# Patient Record
Sex: Female | Born: 1995 | Hispanic: Yes | Marital: Single | Smoking: Never smoker
Health system: Southern US, Community
[De-identification: ages and names within clinical notes are randomized; demographics above are authoritative.]

## PROBLEM LIST (undated history)

## (undated) DIAGNOSIS — D649 Anemia, unspecified: Secondary | ICD-10-CM

## (undated) HISTORY — DX: Anemia, unspecified: D64.9

---
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
## 2014-07-25 NOTE — H&P (Signed)
L&D Evaluation:  History Expanded:  HPI 19 yo G1, EDD of 04/21/13, presents in active labor (4 cm per RN). Originally scheduled for IOL tonight. [facility], late to care at 24 wks, teen pregnancy, severe anemia responding well to Fe replacement. Otherwise unremarkable.   Blood Type (Maternal) O positive   Group B Strep Results Maternal (Result >5wks must be treated as unknown) negative   Maternal HIV Negative   Maternal Syphilis Ab Nonreactive   Maternal Varicella Non-Immune   Rubella Results (Maternal) nonimmune   Maternal T-Dap Unknown   Presents with contractions   Patient's Medical History No Chronic Illness   Patient's Surgical History none   Medications Pre Natal Vitamins  Iron   Allergies NKDA   Social History none   Exam:  Vital Signs stable   General no apparent distress   Mental Status clear   Chest clear   Heart no murmur/gallop/rubs   Abdomen gravid, tender with contractions   Estimated Fetal Weight Average for gestational age, 87 #   Pelvic 4-5 per RN   Mebranes Intact   FHT normal rate with no decels, baseline 145, mod var, + accels   Ucx regular   Impression:  Impression active labor   Plan:  Plan monitor contractions and for cervical change   Comments Given IV meds already Requesting epidural Consider AROM once comfortable after epidural   Electronic Signatures: Vella KohlerBrothers, Livi Mcgann K (CNM)  (Signed 12-Feb-15 11:40)  Authored: L&D Evaluation   Last Updated: 12-Feb-15 11:40 by Vella KohlerBrothers, Taralee Marcus K (CNM)

## 2016-06-11 LAB — HM PAP SMEAR: HM Pap smear: NEGATIVE

## 2018-02-27 ENCOUNTER — Emergency Department: Payer: Self-pay

## 2018-02-27 ENCOUNTER — Other Ambulatory Visit: Payer: Self-pay

## 2018-02-27 ENCOUNTER — Emergency Department
Admission: EM | Admit: 2018-02-27 | Discharge: 2018-02-27 | Disposition: A | Discharge status: Home / Self Care | Payer: Self-pay | Attending: Emergency Medicine | Admitting: Emergency Medicine

## 2018-02-27 DIAGNOSIS — O039 Complete or unspecified spontaneous abortion without complication: Secondary | ICD-10-CM | POA: Insufficient documentation

## 2018-02-27 DIAGNOSIS — O3680X0 Pregnancy with inconclusive fetal viability, not applicable or unspecified: Secondary | ICD-10-CM

## 2018-02-27 DIAGNOSIS — Z3A00 Weeks of gestation of pregnancy not specified: Secondary | ICD-10-CM | POA: Insufficient documentation

## 2018-02-27 LAB — CBC WITH DIFFERENTIAL/PLATELET
Abs Immature Granulocytes: 0.03 10*3/uL (ref 0.00–0.07)
Basophils Absolute: 0 10*3/uL (ref 0.0–0.1)
Basophils Relative: 0 %
Eosinophils Absolute: 0 10*3/uL (ref 0.0–0.5)
Eosinophils Relative: 0 %
HCT: 32.3 % — ABNORMAL LOW (ref 36.0–46.0)
Hemoglobin: 10.4 g/dL — ABNORMAL LOW (ref 12.0–15.0)
IMMATURE GRANULOCYTES: 0 %
LYMPHS PCT: 14 %
Lymphs Abs: 1.3 10*3/uL (ref 0.7–4.0)
MCH: 25.6 pg — ABNORMAL LOW (ref 26.0–34.0)
MCHC: 32.2 g/dL (ref 30.0–36.0)
MCV: 79.6 fL — ABNORMAL LOW (ref 80.0–100.0)
Monocytes Absolute: 0.4 10*3/uL (ref 0.1–1.0)
Monocytes Relative: 4 %
NEUTROS PCT: 82 %
Neutro Abs: 7.5 10*3/uL (ref 1.7–7.7)
Platelets: 213 10*3/uL (ref 150–400)
RBC: 4.06 MIL/uL (ref 3.87–5.11)
RDW: 13.2 % (ref 11.5–15.5)
WBC: 9.2 10*3/uL (ref 4.0–10.5)
nRBC: 0 % (ref 0.0–0.2)

## 2018-02-27 LAB — CBC
HEMATOCRIT: 35.3 % — AB (ref 36.0–46.0)
Hemoglobin: 11.2 g/dL — ABNORMAL LOW (ref 12.0–15.0)
MCH: 25.5 pg — ABNORMAL LOW (ref 26.0–34.0)
MCHC: 31.7 g/dL (ref 30.0–36.0)
MCV: 80.4 fL (ref 80.0–100.0)
Platelets: 223 10*3/uL (ref 150–400)
RBC: 4.39 MIL/uL (ref 3.87–5.11)
RDW: 13.2 % (ref 11.5–15.5)
WBC: 7.3 10*3/uL (ref 4.0–10.5)
nRBC: 0 % (ref 0.0–0.2)

## 2018-02-27 LAB — HCG, QUANTITATIVE, PREGNANCY: hCG, Beta Chain, Quant, S: 3593 m[IU]/mL — ABNORMAL HIGH (ref <5)

## 2018-02-27 LAB — POCT PREGNANCY, URINE: Preg Test, Ur: POSITIVE — AB

## 2018-02-27 MED ORDER — SODIUM CHLORIDE 0.9 % IV BOLUS
1000.0000 mL | Freq: Once | INTRAVENOUS | Status: AC
  Administered 2018-02-27: 1000 mL via INTRAVENOUS

## 2018-02-27 NOTE — Discharge Instructions (Addendum)
Follow-up with Dr. Bonney AidStaebler in 1 week as instructed.  Return to the ER for new, worsening, persistent severe pain, bleeding, weakness or lightheadedness, fevers, or any other new or worsening symptoms that concern you.

## 2018-02-27 NOTE — ED Notes (Signed)
Pelvic exam completed. Patient awaiting GYN consult.

## 2018-02-27 NOTE — ED Notes (Signed)
Patient has not had US for this pregnancy. US ordered.

## 2018-02-27 NOTE — ED Triage Notes (Addendum)
Pt comes via POV from home with c/o vaginal bleeding that started Thursday. Pt states also some brown discharge.  Pt states earlier today she passed a blood clot and has increased bleeding. Pt states she has gone through more pads than earlier. Pt also states that when she got out of the car she noticed that she had blood on pants.  Pt states home pregnancy test resulted positive. LMP was September and test taken in November. Pt has not yet seen OBGYN.  Pt also states abdominal cramping and pain.

## 2018-02-27 NOTE — ED Notes (Addendum)
Reports positive pregnancy test at home not sure how many weeks. LMP 11/23/17. Reports experiencing pain and mild  bleeding that began Thursday, last night bleeding and pain increased reports now also bleeding clots. U/S ordred from triage nurse. Patient awaiting in room, for Md eval/assessment.

## 2018-02-27 NOTE — ED Notes (Signed)
Patient to and from u/s. Awaiting results for further plan of care.

## 2018-02-27 NOTE — ED Notes (Addendum)
OB at bedside to do pelvic exam. Products of Conception withdrawn from cervic, placed in fluid and sent to lab. IV placed and labs drawn. IVF to infused.

## 2018-02-27 NOTE — H&P (Signed)
Obstetrics & Gynecology Consult H&P   Consulting Department: Emergency Deperatment  Consulting Physician: Dionne Bucy, MD  Consulting Question: Pregnancy unknown location   History of Present Illness: Patient is a 22 y.o. G1P0 presenting with vaginal bleeding and cramping.  She reports this started this morning.  The abdominal pain was generalized to the lower abdomen and felt like contractions.  She states abdominal pain has since resolved and she curently has not abdominal pain.   She still reports bleeding.  HCG was past the discrimination zone at 3566mIU/mL.  Ultrasound did not reveal and IUP but did comment on a right ovarian cyst favored to be a corpus luteum.  My review of the images revealed the mass to be elongated and the mass in question to likely be the gestational sac in the vagina.    Her prior pregnancy was uncomplicated in 2015.  Review of Systems:10 point review of systems  Past Medical History:  History reviewed. No pertinent past medical history.  Past Surgical History:  History reviewed. No pertinent surgical history.  Gynecologic History:   Obstetric History: G1P0  Family History:  No family history on file.  Social History:  Social History   Socioeconomic History  . Marital status: Single    Spouse name: Not on file  . Number of children: Not on file  . Years of education: Not on file  . Highest education level: Not on file  Occupational History  . Not on file  Social Needs  . Financial resource strain: Not on file  . Food insecurity:    Worry: Not on file    Inability: Not on file  . Transportation needs:    Medical: Not on file    Non-medical: Not on file  Tobacco Use  . Smoking status: Not on file  Substance and Sexual Activity  . Alcohol use: Not on file  . Drug use: Not on file  . Sexual activity: Not on file  Lifestyle  . Physical activity:    Days per week: Not on file    Minutes per session: Not on file  . Stress: Not  on file  Relationships  . Social connections:    Talks on phone: Not on file    Gets together: Not on file    Attends religious service: Not on file    Active member of club or organization: Not on file    Attends meetings of clubs or organizations: Not on file    Relationship status: Not on file  . Intimate partner violence:    Fear of current or ex partner: Not on file    Emotionally abused: Not on file    Physically abused: Not on file    Forced sexual activity: Not on file  Other Topics Concern  . Not on file  Social History Narrative  . Not on file    Allergies:  Not on File  Medications: Prior to Admission medications   Not on File    Physical Exam Vitals: Blood pressure 100/70, pulse (!) 148, temperature 98.6 F (37 C), temperature source Oral, resp. rate 20, height 5\' 2"  (1.575 m), weight 68 kg, last menstrual period 11/23/2017, SpO2 100 %. General: NAD HEENT: normocephalic, anicteric Pulmonary: No increased work of breathing Cardiovascular: RRR, distal pulses 2+ Abdomen: soft, non-tender, non-destended Genitourinary: Some blood on perineum, bimanual exam revealed clot or mass which prevented palpation of the cervix, this turned out to be in an intact gestational sac.  Cervix was 1.5cm dilated.  On speculum exam no residual products of conception visualized with minimal bleeding Extremities: no edema, erythema, or tenderness Neurologic: Grossly intact Psychiatric: mood appropriate, affect full  Labs: Results for orders placed or performed during the hospital encounter of 02/27/18 (from the past 72 hour(s))  ABO/Rh     Status: None   Collection Time: 02/27/18 11:26 AM  Result Value Ref Range   ABO/RH(D)      O POS Performed at [facility], [address] [place], [place] [address]   CBC     Status: Abnormal   Collection Time: 02/27/18 11:36 AM  Result Value Ref Range   WBC 7.3 4.0 - 10.5 K/uL   RBC 4.39 3.87 - 5.11 MIL/uL   Hemoglobin 11.2 (L)  12.0 - 15.0 g/dL   HCT 08.6 (L) 57.8 - 46.9 %   MCV 80.4 80.0 - 100.0 fL   MCH 25.5 (L) 26.0 - 34.0 pg   MCHC 31.7 30.0 - 36.0 g/dL   RDW 62.9 52.8 - 41.3 %   Platelets 223 150 - 400 K/uL   nRBC 0.0 0.0 - 0.2 %    Comment: Performed at [facility], [address] [place], [place] [address]  hCG, quantitative, pregnancy     Status: Abnormal   Collection Time: 02/27/18 11:36 AM  Result Value Ref Range   hCG, Beta Chain, Quant, S 3,593 (H) <5 mIU/mL    Comment:          GEST. AGE      CONC.  (mIU/mL)   <=1 WEEK        5 - 50     2 WEEKS       50 - 500     3 WEEKS       100 - 10,000     4 WEEKS     1,000 - 30,000     5 WEEKS     3,500 - 115,000   6-8 WEEKS     12,000 - 270,000    12 WEEKS     15,000 - 220,000        FEMALE AND NON-PREGNANT FEMALE:     LESS THAN 5 mIU/mL Performed at [facility], [address] [place], [place] [address]   Pregnancy, urine POC     Status: Abnormal   Collection Time: 02/27/18 11:57 AM  Result Value Ref Range   Preg Test, Ur POSITIVE (A) NEGATIVE    Comment:        THE SENSITIVITY OF THIS METHODOLOGY IS >24 mIU/mL     Imaging US Ob Comp Less 14 Wks  Addendum Date: 02/27/2018   ADDENDUM REPORT: 02/27/2018 15:49 ADDENDUM: Findings discussed with the ER physician. Electronically Signed   By: Gerome Sam III M.D   On: 02/27/2018 15:49   Result Date: 02/27/2018 CLINICAL DATA:  Vaginal bleeding.  Abdominal pain. EXAM: OBSTETRIC <14 WK ULTRASOUND TECHNIQUE: Transabdominal ultrasound was performed for evaluation of the gestation as well as the maternal uterus and adnexal regions. COMPARISON:  None. FINDINGS: Intrauterine gestational sac: None Maternal uterus/adnexae: The uterus measures 9.7 x 5.3 x 7.8 cm with a volume of 198 mL. A mass measuring 7.1 x 4.2 x 6.3 cm and containing a dominant cystic mass measuring 4.7 x 2.5 x 3.5 cm in the right adnexa is favored to be the ovary. A separate right adnexal mass is not noted. The  left ovary measures 3.7 x 1.6 x 2.8 cm. No free fluid in the pelvis.  IMPRESSION: 1. No IUP is identified. The absence of an IUP in the setting of a positive beta HCG could represent an unidentified ectopic pregnancy, recent miscarriage, or early pregnancy. Recommend clinical correlation and close follow-up. 2. The 7.1 x 4.2 x 6.3 cm mass in the right adnexa containing a 4.7 cm dominant cyst is favored to be the right ovary with a dominant corpus luteum cyst. A separate adnexal mass or ovary is not seen. Recommend attention on follow-up. The left ovary is normal. 3. No free fluid in the pelvis. Of note, endovaginal imaging was not performed and is recommended for further characterization. Electronically Signed: By: Gerome Samavid  Williams III M.D On: 02/27/2018 14:50    Assessment: 22 y.o. G1P0  Plan: 1) Vaginal bleeding abdominal pain - intact gestational sac removed from lower uterine segment on bimanual exam and sent to pathology  Condolences were offered to the patient and her family.  I stressed that while emotionally difficult, that this did not occur because of an actions or inactions by the patient.  Somewhere between 10-20% of identified first trimester pregnancies will unfortunately end in miscarriage.  Given this relatively high incidence rate, further diagnostic testing such as chromosome analysis is generally not clinically relevant nor recommended.  Although the chromosomal abnormalities have been implicated at rates as high as 70% in some studies, these are generally random and do not infer and increased risk of recurrence with subsequent pregnancies.  However, 3 or more consecutive first trimester losses are relatively uncommon, and these patient generally do benefit from additional work up to determine a potential modifiable etiology.   We briefly discussed management options including expectant management, medical management, and surgical management as well as their relative success rates and  complications. Approximately 80% of first trimester miscarriages will pass successfully but may require a time frame of up to 8 weeks (ACOG Practice Bulletin 150 May 2015 "Early Pregnancy Loss").  Medical management using 800mcg of misoprostil administered every 3-hrs as needed for up to 3 doses speeds up the time frame to completion significantly, has literature supporting its use up to 63 days or 246w0d gestation and results in a passage rate of 84-85% (ACOG Practice Bulletin 143 March 2014 "Medical Management of First-Trimester Abortion").  Dilation and curettage has the highest rate of uterine evacuation, but carries with is operative cost, surgical and anesthetic risk.  While these risk are relatively small they nevertheless include infection, bleeding, uterine perforation, formation of uterine synechia, and in rare cases death.   We discussed repeat ultrasound and or trending HCG levels if the patient wishes to pursue these prior to making her decision.  Clinically I am confident of the diagnosis, but I do not want any doubts in the patient's mind regarding the plan of management she chooses to adopt.  I will allow the patient and her family to discuss management options and she was advised to contact the office to arrange final disposition one she has made her decision or should she have any follow up questions for myself.    The patient is Rh positive and rhogam is therefore not indicated.     Vena AustriaAndreas Jalena Vanderlinden, MD, Evern CoreFACOG [facility], [facility] 02/27/2018, 4:53 PM

## 2018-02-27 NOTE — ED Provider Notes (Signed)
[facility] Emergency Department Provider Note ____________________________________________   First MD Initiated Contact with Patient 02/27/18 1352     (approximate)  I have reviewed the triage vital signs and the nursing notes.   HISTORY  Chief Complaint Vaginal Bleeding    HPI Kaitlyn Steele is a 22 y.o. female with PMH as noted below who is a G2 P1 and who presents with vaginal bleeding, gradual onset 2 days ago, but more severe and acute since 3 AM last night with clots.  The patient also reports lower abdominal crampy pain since 3 AM.  She states that since she has been in the ED it has been subsiding somewhat.  She denies lightheadedness or dizziness.  She denies vomiting or fever.  The patient states that her last normal period was in September and she has taken 2 pregnancy tests which were positive.  History reviewed. No pertinent past medical history.  There are no active problems to display for this patient.   History reviewed. No pertinent surgical history.  Prior to Admission medications   Not on File    Allergies Patient has no allergy information on record.  No family history on file.  Social History Social History   Tobacco Use  . Smoking status: Not on file  Substance Use Topics  . Alcohol use: Not on file  . Drug use: Not on file    Review of Systems  Constitutional: No fever. Eyes: No redness. ENT: No sore throat. Cardiovascular: Denies chest pain. Respiratory: Denies shortness of breath. Gastrointestinal: No vomiting.  Genitourinary: Positive for vaginal bleeding.  Musculoskeletal: Negative for back pain. Skin: Negative for rash. Neurological: Negative for headache.   ____________________________________________   PHYSICAL EXAM:  VITAL SIGNS: ED Triage Vitals  Enc Vitals Group     BP 02/27/18 1133 113/74     Pulse Rate 02/27/18 1133 (!) 127     Resp 02/27/18 1133 18     Temp 02/27/18 1133 99.3 F (37.4  C)     Temp Source 02/27/18 1432 Oral     SpO2 02/27/18 1133 97 %     Weight 02/27/18 1132 150 lb (68 kg)     Height 02/27/18 1132 5\' 2"  (1.575 m)     Head Circumference --      Peak Flow --      Pain Score 02/27/18 1132 6     Pain Loc --      Pain Edu? --      Excl. in GC? --     Constitutional: Alert and oriented. Well appearing and in no acute distress. Eyes: Conjunctivae are normal.  No pallor. Head: Atraumatic. Nose: No congestion/rhinnorhea. Mouth/Throat: Mucous membranes are moist.   Neck: Normal range of motion.  Cardiovascular:  Good peripheral circulation. Respiratory: Normal respiratory effort.   Gastrointestinal: Soft and nontender. No distention.  Genitourinary: Moderate amount of blood in the vaginal vault.  Cervical os difficult to palpate. Musculoskeletal: Extremities warm and well perfused.  Neurologic:  Normal speech and language. No gross focal neurologic deficits are appreciated.  Skin:  Skin is warm and dry. No rash noted. Psychiatric: Mood and affect are normal. Speech and behavior are normal.  ____________________________________________   LABS (all labs ordered are listed, but only abnormal results are displayed)  Labs Reviewed  CBC - Abnormal; Notable for the following components:      Result Value   Hemoglobin 11.2 (*)    HCT 35.3 (*)    MCH 25.5 (*)  All other components within normal limits  HCG, QUANTITATIVE, PREGNANCY - Abnormal; Notable for the following components:   hCG, Beta Chain, Quant, S 3,593 (*)    All other components within normal limits  CBC WITH DIFFERENTIAL/PLATELET - Abnormal; Notable for the following components:   Hemoglobin 10.4 (*)    HCT 32.3 (*)    MCV 79.6 (*)    MCH 25.6 (*)    All other components within normal limits  POCT PREGNANCY, URINE - Abnormal; Notable for the following components:   Preg Test, Ur POSITIVE (*)    All other components within normal limits  POC URINE PREG, ED  ABO/RH  SURGICAL  PATHOLOGY   ____________________________________________  EKG   ____________________________________________  RADIOLOGY  US pelvis: No IUP identified.  Right ovarian mass.  No significant free fluid.  ____________________________________________   PROCEDURES  Procedure(s) performed: No  Procedures  Critical Care performed: No ____________________________________________   INITIAL IMPRESSION / ASSESSMENT AND PLAN / ED COURSE  Pertinent labs & imaging results that were available during my care of the patient were reviewed by me and considered in my medical decision making (see chart for details).  22 year old female G2P1 at unknown dates presents with vaginal bleeding over 2 days, with worsening bleeding and crampy pain since 3 AM last night.  On exam the patient is relatively well-appearing.  She was initially slightly tachycardic but her vital signs are now normal.  The remainder of the exam is as described above.  Her abdomen is soft and nontender.  Overall the most likely etiology is miscarriage, however differential also includes ectopic or threatened abortion.  We will obtain lab work-up, ultrasound, and reassess.  ----------------------------------------- 7:42 PM on 02/27/2018 -----------------------------------------  Ultrasound showed no IUP, and a right adnexal cyst or mass.  The patient's vital signs remained relatively stable although she was borderline tachycardic, and with any discomfort such as during the pelvic exam, she became significantly tachycardic.  Although the ultrasound findings were more consistent with spontaneous abortion, since she had no prior confirmed IUP, differential also included ectopic pregnancy.  The patient continued to appear well and her abdomen was soft and nontender.  I had difficulty adequately assessing the cervix on pelvic exam.  I consulted Dr. Bonney AidStaebler from OB/GYN who came and evaluated the patient.  He initially considered  admission for observation, however when he performed a pelvic exam the cervical loss was open and he extracted a significant amount of products of conception which then relieved the patient's symptoms.  He advised that her presentation was consistent with spontaneous abortion.  Repeat CBC was stable, the patient's vital signs normalized after fluid.  She continues to appear comfortable and had no further pain.  I counseled her on return precautions and she expressed understanding.  Dr. Bonney AidStaebler cleared her for discharge and recommended follow-up next week.  ____________________________________________   FINAL CLINICAL IMPRESSION(S) / ED DIAGNOSES  Final diagnoses:  Spontaneous miscarriage      NEW MEDICATIONS STARTED DURING THIS VISIT:  There are no discharge medications for this patient.    Note:  This document was prepared using Dragon voice recognition software and may include unintentional dictation errors.    Dionne BucySiadecki, Pervis Macintyre, MD 02/27/18 1944

## 2018-03-02 LAB — SURGICAL PATHOLOGY

## 2018-03-07 LAB — ABO/RH: ABO/RH(D): O POS

## 2018-10-25 ENCOUNTER — Other Ambulatory Visit: Payer: Self-pay

## 2018-10-25 DIAGNOSIS — Z20822 Contact with and (suspected) exposure to covid-19: Secondary | ICD-10-CM

## 2018-10-26 LAB — NOVEL CORONAVIRUS, NAA: SARS-CoV-2, NAA: NOT DETECTED

## 2019-02-09 ENCOUNTER — Ambulatory Visit (LOCAL_COMMUNITY_HEALTH_CENTER): Payer: Self-pay

## 2019-02-09 ENCOUNTER — Other Ambulatory Visit: Payer: Self-pay

## 2019-02-09 VITALS — BP 127/78 | Ht 62.0 in | Wt 146.5 lb

## 2019-02-09 DIAGNOSIS — Z3201 Encounter for pregnancy test, result positive: Secondary | ICD-10-CM

## 2019-02-09 LAB — PREGNANCY, URINE: Preg Test, Ur: POSITIVE — AB

## 2019-02-09 MED ORDER — PRENATAL VITAMIN 27-0.8 MG PO TABS: 1.0000 | ORAL_TABLET | Freq: Every day | ORAL | 0 refills | Status: AC

## 2019-02-09 NOTE — Progress Notes (Signed)
Pt plans prenatal care at [facility]; declined preadmit, states she already talked to clerk about speaking with a Medicaid worker.

## 2019-03-17 ENCOUNTER — Ambulatory Visit (INDEPENDENT_AMBULATORY_CARE_PROVIDER_SITE_OTHER): Payer: Medicaid Other | Admitting: Obstetrics & Gynecology

## 2019-03-17 ENCOUNTER — Ambulatory Visit (INDEPENDENT_AMBULATORY_CARE_PROVIDER_SITE_OTHER): Payer: Medicaid Other

## 2019-03-17 ENCOUNTER — Other Ambulatory Visit (HOSPITAL_COMMUNITY)
Admission: RE | Admit: 2019-03-17 | Discharge: 2019-03-17 | Disposition: A | Discharge status: Home / Self Care | Payer: Medicaid Other | Source: Ambulatory Visit | Attending: Obstetrics & Gynecology | Admitting: Obstetrics & Gynecology

## 2019-03-17 ENCOUNTER — Encounter: Payer: Self-pay | Admitting: Obstetrics & Gynecology

## 2019-03-17 ENCOUNTER — Other Ambulatory Visit: Payer: Self-pay

## 2019-03-17 VITALS — BP 110/60 | Ht 62.0 in | Wt 147.0 lb

## 2019-03-17 DIAGNOSIS — N926 Irregular menstruation, unspecified: Secondary | ICD-10-CM | POA: Diagnosis not present

## 2019-03-17 DIAGNOSIS — Z1379 Encounter for other screening for genetic and chromosomal anomalies: Secondary | ICD-10-CM

## 2019-03-17 DIAGNOSIS — O285 Abnormal chromosomal and genetic finding on antenatal screening of mother: Secondary | ICD-10-CM

## 2019-03-17 DIAGNOSIS — Z3A12 12 weeks gestation of pregnancy: Secondary | ICD-10-CM | POA: Diagnosis not present

## 2019-03-17 DIAGNOSIS — Z124 Encounter for screening for malignant neoplasm of cervix: Secondary | ICD-10-CM | POA: Diagnosis not present

## 2019-03-17 DIAGNOSIS — O021 Missed abortion: Secondary | ICD-10-CM | POA: Diagnosis not present

## 2019-03-17 DIAGNOSIS — N8312 Corpus luteum cyst of left ovary: Secondary | ICD-10-CM | POA: Diagnosis not present

## 2019-03-17 DIAGNOSIS — O3481 Maternal care for other abnormalities of pelvic organs, first trimester: Secondary | ICD-10-CM | POA: Diagnosis not present

## 2019-03-17 DIAGNOSIS — Z3A09 9 weeks gestation of pregnancy: Secondary | ICD-10-CM | POA: Diagnosis not present

## 2019-03-17 LAB — POCT URINALYSIS DIPSTICK OB: Glucose, UA: NEGATIVE

## 2019-03-17 NOTE — Progress Notes (Addendum)
03/17/2019   Chief Complaint: Missed period  History of Present Illness: Ms. Fuston is a 23 y.o. G3P1011 [redacted]w[redacted]d based on Patient's last menstrual period was 12/18/2018 (exact date). with an Estimated Date of Delivery: 09/24/19, with the above CC.   Her periods were: irregular periods from 28 to 35 days She was using no method when she conceived.  She has Positive signs or symptoms of nausea/vomiting of pregnancy. She has Negative signs or symptoms of miscarriage or preterm labor She identifies Negative Zika risk factors for her and her partner On any different medications around the time she conceived/early pregnancy: No  History of varicella: Yes   ROS: A 12-point review of systems was performed and negative, except as stated in the above HPI.  OBGYN History: As per HPI. OB History  Gravida Para Term Preterm AB Living  3 1 1  0 1 1  SAB TAB Ectopic Multiple Live Births  1 0 0 0 1    # Outcome Date GA Lbr Len/2nd Weight Sex Delivery Anes PTL Lv  3 Current           2 SAB           1 Term             Any issues with any prior pregnancies: no Any prior children are healthy, doing well, without any problems or issues: yes History of pap smears: No. Last pap smear 2018. Abnormal: no  History of STIs: No   Past Medical History: Past Medical History:  Diagnosis Date  . Anemia     Past Surgical History: History reviewed. No pertinent surgical history.  Family History:  Family History  Problem Relation Age of Onset  . Hypertension Father    She denies any female cancers, bleeding or blood clotting disorders.  She denies any history of mental retardation, birth defects or genetic disorders in her or the FOB's history  Social History:  Social History   Socioeconomic History  . Marital status: Single    Spouse name: Not on file  . Number of children: Not on file  . Years of education: Not on file  . Highest education level: Not on file  Occupational History  . Not on  file  Tobacco Use  . Smoking status: Never Smoker  . Smokeless tobacco: Never Used  Substance and Sexual Activity  . Alcohol use: Never  . Drug use: Never  . Sexual activity: Yes  Other Topics Concern  . Not on file  Social History Narrative  . Not on file   Social Determinants of Health   Financial Resource Strain:   . Difficulty of Paying Living Expenses: Not on file  Food Insecurity:   . Worried About 2019 in the Last Year: Not on file  . Ran Out of Food in the Last Year: Not on file  Transportation Needs:   . Lack of Transportation (Medical): Not on file  . Lack of Transportation (Non-Medical): Not on file  Physical Activity:   . Days of Exercise per Week: Not on file  . Minutes of Exercise per Session: Not on file  Stress:   . Feeling of Stress : Not on file  Social Connections:   . Frequency of Communication with Friends and Family: Not on file  . Frequency of Social Gatherings with Friends and Family: Not on file  . Attends Religious Services: Not on file  . Active Member of Clubs or Organizations: Not on file  .  Attends Archivist Meetings: Not on file  . Marital Status: Not on file  Intimate Partner Violence: Not At Risk  . Fear of Current or Ex-Partner: No  . Emotionally Abused: No  . Physically Abused: No  . Sexually Abused: No   Any pets in the household: no  Allergy: No Known Allergies  Current Outpatient Medications:  Current Outpatient Medications:  .  Prenatal Vit-Fe Fumarate-FA (PRENATAL VITAMIN) 27-0.8 MG TABS, Take 1 tablet by mouth daily., Disp: 100 tablet, Rfl: 0   Physical Exam:   BP 110/60   Wt 147 lb (66.7 kg)   LMP 12/18/2018 (Exact Date) Comment: normal  BMI 26.89 kg/m  Body mass index is 26.89 kg/m. Constitutional: Well nourished, well developed female in no acute distress.  Neck:  Supple, normal appearance, and no thyromegaly  Cardiovascular: S1, S2 normal, no murmur, rub or gallop, regular rate and  rhythm Respiratory:  Clear to auscultation bilateral. Normal respiratory effort Abdomen: positive bowel sounds and no masses, hernias; diffusely non tender to palpation, non distended Breasts: breasts appear normal, no suspicious masses, no skin or nipple changes or axillary nodes. Neuro/Psych:  Normal mood and affect.  Skin:  Warm and dry.  Lymphatic:  No inguinal lymphadenopathy.  Doppler- no FHT  Pelvic exam: is not limited by body habitus EGBUS: within normal limits, Vagina: within normal limits and with no blood in the vault, Cervix: normal appearing cervix without discharge or lesions, closed/long/high, Uterus:  enlarged: 12 weeks, and Adnexa:  normal adnexa  Assessment: Ms. Cacho is a 23 y.o. G3P1011 [redacted]w[redacted]d based on Patient's last menstrual period was 12/18/2018 (exact date). with an Estimated Date of Delivery: 09/24/19,  for prenatal care.  Plan:  1) Avoid alcoholic beverages. 2) Patient encouraged not to smoke.  3) Discontinue the use of all non-medicinal drugs and chemicals.  4) Take prenatal vitamins daily.  5) Seatbelt use advised 6) Nutrition, food safety (fish, cheese advisories, and high nitrite foods) and exercise discussed. 7) Hospital and practice style delivering at [facility] discussed  8) Patient is asked about travel to areas at risk for the Ages virus, and counseled to avoid travel and exposure to mosquitoes or sexual partners who may have themselves been exposed to the virus. Testing is discussed, and will be ordered as appropriate.  9) Childbirth classes at [facility] advised 10) Genetic Screening, such as with 1st Trimester Screening, cell free fetal DNA, AFP testing, and Ultrasound, as well as with amniocentesis and CVS as appropriate, is discussed with patient. She plans to have genetic testing this pregnancy.  Problem list reviewed and updated.  Barnett Applebaum, MD, [facility], [facility] 03/17/2019  3:41 PM  Review of ULTRASOUND.    I have  personally reviewed images and report of recent ultrasound done at [facility].    Plan of management to be discussed with patient.    US reveals no FHT in measured 9 week CRL, collapsing yolk sac    Plan recheck one week    Discussed options of expectant mgt vs D&C  Labs cancelled PAP done  Barnett Applebaum, MD, [facility], [facility] 03/17/2019  4:23 PM

## 2019-03-17 NOTE — Addendum Note (Signed)
Addended by: Gae Dry on: 03/17/2019 04:25 PM   Modules accepted: Orders

## 2019-03-17 NOTE — Addendum Note (Signed)
Addended by: Quintella Baton D on: 03/17/2019 03:57 PM   Modules accepted: Orders

## 2019-03-19 LAB — URINE CULTURE

## 2019-03-22 LAB — CYTOLOGY - PAP
Chlamydia: NEGATIVE
Comment: NEGATIVE
Comment: NEGATIVE
Comment: NORMAL
Diagnosis: NEGATIVE
Neisseria Gonorrhea: NEGATIVE
Trichomonas: NEGATIVE

## 2019-03-22 LAB — RPR+RH+ABO+RUB AB+AB SCR+CB...

## 2019-03-25 ENCOUNTER — Ambulatory Visit: Payer: Medicaid Other | Admitting: Obstetrics & Gynecology

## 2019-03-25 ENCOUNTER — Ambulatory Visit (INDEPENDENT_AMBULATORY_CARE_PROVIDER_SITE_OTHER): Payer: Medicaid Other

## 2019-03-25 ENCOUNTER — Other Ambulatory Visit: Payer: Self-pay | Admitting: Obstetrics & Gynecology

## 2019-03-25 ENCOUNTER — Ambulatory Visit: Payer: Medicaid Other

## 2019-03-25 ENCOUNTER — Other Ambulatory Visit: Payer: Medicaid Other

## 2019-03-25 DIAGNOSIS — O021 Missed abortion: Secondary | ICD-10-CM

## 2019-03-25 DIAGNOSIS — Z3A09 9 weeks gestation of pregnancy: Secondary | ICD-10-CM

## 2019-03-25 NOTE — Progress Notes (Signed)
Review of ULTRASOUND.    I have personally reviewed images and report of recent ultrasound done at [facility].    Plan of management to be discussed with patient.    Missed abortion confirmed, no growth in 2 weeks and no FHT again.    Options of Exp Mgt vs D&C discussed; pt to discuss and decide  Annamarie Major, MD, [facility], [facility] 03/25/2019  2:46 PM

## 2019-03-28 ENCOUNTER — Telehealth: Payer: Self-pay | Admitting: Obstetrics & Gynecology

## 2019-03-28 ENCOUNTER — Other Ambulatory Visit: Payer: Self-pay | Admitting: Obstetrics & Gynecology

## 2019-03-28 NOTE — Telephone Encounter (Signed)
Surgery Booking Request Patient Full Name:  Nyema Hachey  MRN: [id number]  DOB: Feb 12, 1996  Surgeon: Letitia Libra, MD  Requested Surgery Date and Time: Thurs THIS WEEK (or Fri at lunchtime if a must) Primary Diagnosis AND Code: Missed Abortion Secondary Diagnosis and Code:  Surgical Procedure: Suction D&C L&D Notification: No Admission Status: same day surgery Length of Surgery: 30 min Special Case Needs: No H&P: Yes Phone Interview???:  Yes Interpreter: No Language:  Medical Clearance:  No Special Scheduling Instructions: no Any known health/anesthesia issues, diabetes, sleep apnea, latex allergy, defibrillator/pacemaker?: No Acuity: P2   (P1 highest, P2 delay may cause harm, P3 low, elective gyn, P4 lowest) Priority 1

## 2019-03-28 NOTE — Telephone Encounter (Signed)
Lmtrc

## 2019-03-29 ENCOUNTER — Other Ambulatory Visit: Payer: Self-pay

## 2019-03-29 ENCOUNTER — Encounter
Admission: RE | Admit: 2019-03-29 | Discharge: 2019-03-29 | Disposition: A | Discharge status: Home / Self Care | Payer: Medicaid Other | Source: Ambulatory Visit | Attending: Obstetrics & Gynecology | Admitting: Obstetrics & Gynecology

## 2019-03-29 NOTE — Telephone Encounter (Signed)
Per patient, she has been contacted this morning for her Pre-admit Testing phone interview, has received instructions for COVID testing tomorrow morning, received the phone# to Same Day Surgery to call on Wed 1-3pm for arrival time/instructions, and that Pre-admit Testing gave her instructions regarding when not to eat/drink and when she should or shouldn't take medications. Patient said she did not have any questions.

## 2019-03-29 NOTE — Patient Instructions (Signed)
Your procedure is scheduled on: Thursday, January 14 Report to Day Surgery on the 2nd floor of the [facility] To find out your arrival time, please call [phone] between 1PM - 3PM on: Wednesday, January 13  REMEMBER: Instructions that are not followed completely may result in serious medical risk, up to and including death; or upon the discretion of your surgeon and anesthesiologist your surgery may need to be rescheduled.  Do not eat food after midnight the night before surgery.  No gum chewing, lozengers or hard candies.  You may however, drink CLEAR liquids up to 2 hours before you are scheduled to arrive for your surgery. Do not drink anything within 2 hours of the start of your surgery.  Clear liquids include: - water  - apple juice without pulp - gatorade - black coffee or tea (Do NOT add milk or creamers to the coffee or tea) Do NOT drink anything that is not on this list.  No Alcohol for 24 hours before or after surgery.  No Smoking including e-cigarettes for 24 hours prior to surgery.  No chewable tobacco products for at least 6 hours prior to surgery.  No nicotine patches on the day of surgery.  On the morning of surgery brush your teeth with toothpaste and water, you may rinse your mouth with mouthwash if you wish. Do not swallow any toothpaste or mouthwash.  Notify your doctor if there is any change in your medical condition (cold, fever, infection).  Do not wear jewelry, make-up, hairpins, clips or nail polish.  Do not wear lotions, powders, or perfumes.   Do not shave 48 hours prior to surgery.   Contacts and dentures may not be worn into surgery.  Do not bring valuables to the hospital, including drivers license, insurance or credit cards.  [name] is not responsible for any belongings or valuables.   TAKE THESE MEDICATIONS THE MORNING OF SURGERY:  none  Stop Anti-inflammatories (NSAIDS) such as Advil, Aleve, Ibuprofen, Motrin, Naproxen, Naprosyn  and Aspirin based products such as Excedrin, Goodys Powder, BC Powder. (May take Tylenol or Acetaminophen if needed.)  Stop ANY OVER THE COUNTER supplements until after surgery. (May continue multivitamin.)  Wear comfortable clothing (specific to your surgery type) to the hospital.  If you are being discharged the day of surgery, you will not be allowed to drive home. You will need a responsible adult to drive you home and stay with you that night.   If you are taking public transportation, you will need to have a responsible adult with you. Please confirm with your physician that it is acceptable to use public transportation.   Please call [phone] if you have any questions about these instructions.

## 2019-03-30 ENCOUNTER — Other Ambulatory Visit
Admission: RE | Admit: 2019-03-30 | Discharge: 2019-03-30 | Disposition: A | Discharge status: Home / Self Care | Payer: Medicaid Other | Source: Ambulatory Visit | Attending: Obstetrics & Gynecology | Admitting: Obstetrics & Gynecology

## 2019-03-30 DIAGNOSIS — Z01812 Encounter for preprocedural laboratory examination: Secondary | ICD-10-CM | POA: Insufficient documentation

## 2019-03-30 DIAGNOSIS — Z20822 Contact with and (suspected) exposure to covid-19: Secondary | ICD-10-CM | POA: Insufficient documentation

## 2019-03-30 LAB — CBC
HCT: 28.3 % — ABNORMAL LOW (ref 36.0–46.0)
Hemoglobin: 8.2 g/dL — ABNORMAL LOW (ref 12.0–15.0)
MCH: 19.4 pg — ABNORMAL LOW (ref 26.0–34.0)
MCHC: 29 g/dL — ABNORMAL LOW (ref 30.0–36.0)
MCV: 67.1 fL — ABNORMAL LOW (ref 80.0–100.0)
Platelets: 207 10*3/uL (ref 150–400)
RBC: 4.22 MIL/uL (ref 3.87–5.11)
RDW: 18.4 % — ABNORMAL HIGH (ref 11.5–15.5)
WBC: 6.1 10*3/uL (ref 4.0–10.5)
nRBC: 0 % (ref 0.0–0.2)

## 2019-03-30 LAB — TYPE AND SCREEN
ABO/RH(D): O POS
Antibody Screen: NEGATIVE
Extend sample reason: UNDETERMINED

## 2019-03-30 LAB — SARS CORONAVIRUS 2 (TAT 6-24 HRS): SARS Coronavirus 2: NEGATIVE

## 2019-03-31 ENCOUNTER — Encounter: Payer: Self-pay | Admitting: Obstetrics & Gynecology

## 2019-03-31 ENCOUNTER — Ambulatory Visit: Payer: Medicaid Other | Admitting: Anesthesiology

## 2019-03-31 ENCOUNTER — Other Ambulatory Visit: Payer: Self-pay

## 2019-03-31 ENCOUNTER — Ambulatory Visit
Admission: RE | Admit: 2019-03-31 | Discharge: 2019-03-31 | Disposition: A | Discharge status: Home / Self Care | Payer: Medicaid Other | Attending: Obstetrics & Gynecology | Admitting: Obstetrics & Gynecology

## 2019-03-31 ENCOUNTER — Encounter
Admission: RE | Disposition: A | Discharge status: Home / Self Care | Payer: Self-pay | Source: Home / Self Care | Attending: Obstetrics & Gynecology

## 2019-03-31 DIAGNOSIS — Z3A10 10 weeks gestation of pregnancy: Secondary | ICD-10-CM

## 2019-03-31 DIAGNOSIS — O021 Missed abortion: Secondary | ICD-10-CM | POA: Diagnosis present

## 2019-03-31 HISTORY — PX: DILATION AND EVACUATION: SHX1459

## 2019-03-31 LAB — HEMOGLOBIN: Hemoglobin: 7.1 g/dL — ABNORMAL LOW (ref 12.0–15.0)

## 2019-03-31 SURGERY — DILATION AND EVACUATION, UTERUS
Anesthesia: General

## 2019-03-31 MED ORDER — KETOROLAC TROMETHAMINE 30 MG/ML IJ SOLN
INTRAMUSCULAR | Status: DC | PRN
  Administered 2019-03-31: 30 mg via INTRAVENOUS

## 2019-03-31 MED ORDER — LACTATED RINGERS IV SOLN: INTRAVENOUS | Status: DC

## 2019-03-31 MED ORDER — FENTANYL CITRATE (PF) 100 MCG/2ML IJ SOLN: 25.0000 ug | INTRAMUSCULAR | Status: DC | PRN

## 2019-03-31 MED ORDER — IBUPROFEN 400 MG PO TABS: 400.0000 mg | ORAL_TABLET | Freq: Four times a day (QID) | ORAL | 0 refills | Status: DC | PRN

## 2019-03-31 MED ORDER — FENTANYL CITRATE (PF) 100 MCG/2ML IJ SOLN
INTRAMUSCULAR | Status: DC | PRN
  Administered 2019-03-31: 25 ug via INTRAVENOUS
  Administered 2019-03-31: 50 ug via INTRAVENOUS
  Administered 2019-03-31: 25 ug via INTRAVENOUS

## 2019-03-31 MED ORDER — ACETAMINOPHEN 325 MG PO TABS: 650.0000 mg | ORAL_TABLET | ORAL | Status: DC | PRN

## 2019-03-31 MED ORDER — OXYTOCIN 10 UNIT/ML IJ SOLN
INTRAMUSCULAR | Status: DC | PRN
  Administered 2019-03-31: 40 [IU]

## 2019-03-31 MED ORDER — FAMOTIDINE 20 MG PO TABS: 20.0000 mg | ORAL_TABLET | Freq: Once | ORAL | Status: AC

## 2019-03-31 MED ORDER — ACETAMINOPHEN 10 MG/ML IV SOLN
INTRAVENOUS | Status: DC | PRN
  Administered 2019-03-31: 1000 mg via INTRAVENOUS

## 2019-03-31 MED ORDER — PHENYLEPHRINE HCL (PRESSORS) 10 MG/ML IV SOLN
INTRAVENOUS | Status: DC | PRN
  Administered 2019-03-31 (×5): 100 ug via INTRAVENOUS
  Administered 2019-03-31 (×3): 200 ug via INTRAVENOUS

## 2019-03-31 MED ORDER — DOXYCYCLINE HYCLATE 100 MG PO TABS: 100.0000 mg | ORAL_TABLET | Freq: Two times a day (BID) | ORAL | 0 refills | Status: DC

## 2019-03-31 MED ORDER — DOXYCYCLINE HYCLATE 100 MG PO TABS
100.0000 mg | ORAL_TABLET | Freq: Two times a day (BID) | ORAL | Status: DC
  Filled 2019-03-31: qty 1

## 2019-03-31 MED ORDER — DEXAMETHASONE SODIUM PHOSPHATE 10 MG/ML IJ SOLN
INTRAMUSCULAR | Status: DC | PRN
  Administered 2019-03-31: 10 mg via INTRAVENOUS

## 2019-03-31 MED ORDER — ONDANSETRON HCL 4 MG/2ML IJ SOLN: 4.0000 mg | Freq: Once | INTRAMUSCULAR | Status: DC | PRN

## 2019-03-31 MED ORDER — IBUPROFEN 400 MG PO TABS
400.0000 mg | ORAL_TABLET | Freq: Four times a day (QID) | ORAL | Status: DC | PRN
  Filled 2019-03-31: qty 1

## 2019-03-31 MED ORDER — OXYCODONE HCL 5 MG PO TABS: 5.0000 mg | ORAL_TABLET | Freq: Once | ORAL | Status: DC | PRN

## 2019-03-31 MED ORDER — ACETAMINOPHEN 650 MG RE SUPP
650.0000 mg | RECTAL | Status: DC | PRN
  Filled 2019-03-31: qty 1

## 2019-03-31 MED ORDER — KETOROLAC TROMETHAMINE 30 MG/ML IJ SOLN: 30.0000 mg | Freq: Once | INTRAMUSCULAR | Status: DC

## 2019-03-31 MED ORDER — MIDAZOLAM HCL 2 MG/2ML IJ SOLN
INTRAMUSCULAR | Status: DC | PRN
  Administered 2019-03-31: 2 mg via INTRAVENOUS

## 2019-03-31 MED ORDER — FENTANYL CITRATE (PF) 100 MCG/2ML IJ SOLN
INTRAMUSCULAR | Status: AC
  Filled 2019-03-31: qty 2

## 2019-03-31 MED ORDER — LIDOCAINE HCL (CARDIAC) PF 100 MG/5ML IV SOSY
PREFILLED_SYRINGE | INTRAVENOUS | Status: DC | PRN
  Administered 2019-03-31: 60 mg via INTRAVENOUS

## 2019-03-31 MED ORDER — ONDANSETRON HCL 4 MG/2ML IJ SOLN
INTRAMUSCULAR | Status: DC | PRN
  Administered 2019-03-31: 4 mg via INTRAVENOUS

## 2019-03-31 MED ORDER — METHYLERGONOVINE MALEATE 0.2 MG PO TABS: 0.2000 mg | ORAL_TABLET | Freq: Four times a day (QID) | ORAL | 0 refills | Status: DC

## 2019-03-31 MED ORDER — PROPOFOL 10 MG/ML IV BOLUS
INTRAVENOUS | Status: AC
  Filled 2019-03-31: qty 20

## 2019-03-31 MED ORDER — FAMOTIDINE 20 MG PO TABS
ORAL_TABLET | ORAL | Status: AC
  Administered 2019-03-31: 12:00:00 20 mg via ORAL
  Filled 2019-03-31: qty 1

## 2019-03-31 MED ORDER — MORPHINE SULFATE (PF) 4 MG/ML IV SOLN: 1.0000 mg | INTRAVENOUS | Status: DC | PRN

## 2019-03-31 MED ORDER — OXYCODONE HCL 5 MG/5ML PO SOLN: 5.0000 mg | Freq: Once | ORAL | Status: DC | PRN

## 2019-03-31 MED ORDER — METHYLERGONOVINE MALEATE 0.2 MG PO TABS
0.2000 mg | ORAL_TABLET | Freq: Four times a day (QID) | ORAL | Status: DC
  Filled 2019-03-31: qty 1

## 2019-03-31 MED ORDER — OXYTOCIN 40 UNITS IN NORMAL SALINE INFUSION - SIMPLE MED
INTRAVENOUS | Status: DC | PRN
  Administered 2019-03-31: 500 mL via INTRAVENOUS
  Administered 2019-03-31: 1000 mL via INTRAVENOUS

## 2019-03-31 MED ORDER — MIDAZOLAM HCL 2 MG/2ML IJ SOLN
INTRAMUSCULAR | Status: AC
  Filled 2019-03-31: qty 2

## 2019-03-31 MED ORDER — OXYTOCIN 10 UNIT/ML IJ SOLN
INTRAMUSCULAR | Status: AC
  Filled 2019-03-31: qty 4

## 2019-03-31 MED ORDER — PROPOFOL 10 MG/ML IV BOLUS
INTRAVENOUS | Status: DC | PRN
  Administered 2019-03-31: 200 mg via INTRAVENOUS

## 2019-03-31 MED ORDER — ACETAMINOPHEN 10 MG/ML IV SOLN: 1000.0000 mg | Freq: Once | INTRAVENOUS | Status: DC | PRN

## 2019-03-31 SURGICAL SUPPLY — 23 items
BAG COUNTER SPONGE EZ (MISCELLANEOUS) ×2 IMPLANT
CANISTER SUC SOCK COL 7IN (MISCELLANEOUS) ×3 IMPLANT
CATH ROBINSON RED A/P 16FR (CATHETERS) ×3 IMPLANT
COUNTER SPONGE BAG EZ (MISCELLANEOUS) ×1
COVER WAND RF STERILE (DRAPES) ×3 IMPLANT
FILTER UTR ASPR SPEC (MISCELLANEOUS) ×1 IMPLANT
FLTR UTR ASPR SPEC (MISCELLANEOUS) ×3
GLOVE BIO SURGEON STRL SZ8 (GLOVE) ×3 IMPLANT
GOWN STRL REUS W/ TWL LRG LVL3 (GOWN DISPOSABLE) ×1 IMPLANT
GOWN STRL REUS W/ TWL XL LVL3 (GOWN DISPOSABLE) ×1 IMPLANT
GOWN STRL REUS W/TWL LRG LVL3 (GOWN DISPOSABLE) ×2
GOWN STRL REUS W/TWL XL LVL3 (GOWN DISPOSABLE) ×2
KIT BERKELEY 1ST TRIMESTER 3/8 (MISCELLANEOUS) ×3 IMPLANT
KIT TURNOVER CYSTO (KITS) ×3 IMPLANT
NS IRRIG 500ML POUR BTL (IV SOLUTION) ×3 IMPLANT
PACK DNC HYST (MISCELLANEOUS) ×3 IMPLANT
PAD OB MATERNITY 4.3X12.25 (PERSONAL CARE ITEMS) ×3 IMPLANT
PAD PREP 24X41 OB/GYN DISP (PERSONAL CARE ITEMS) ×3 IMPLANT
SET BERKELEY SUCTION TUBING (SUCTIONS) ×3 IMPLANT
TOWEL OR 17X26 4PK STRL BLUE (TOWEL DISPOSABLE) ×3 IMPLANT
VACURETTE 10 RIGID CVD (CANNULA) IMPLANT
VACURETTE 12 RIGID CVD (CANNULA) IMPLANT
VACURETTE 8 RIGID CVD (CANNULA) ×3 IMPLANT

## 2019-03-31 NOTE — H&P (Signed)
@LOGO @    PRE-OPERATIVE HISTORY AND PHYSICAL EXAM  HPI:  Kaitlyn Steele is a 24 y.o. G3P1011 Patient's last menstrual period was 12/18/2018 (exact date).; she is being admitted for surgery related to missed abortion.   Pt has had 2 02/17/2019 showing 9 week sized fetal CRL with no FHTs.  She denies VB or pain.  No nausea.[place]  PMHx: Past Medical History:  Diagnosis Date  . Anemia    History reviewed. No pertinent surgical history. Family History  Problem Relation Age of Onset  . Hypertension Father    Social History   Tobacco Use  . Smoking status: Never Smoker  . Smokeless tobacco: Never Used  Substance Use Topics  . Alcohol use: Never  . Drug use: Never    Current Facility-Administered Medications:  .  lactated ringers infusion, , Intravenous, Continuous, Marland Kitchen, MD .  lactated ringers infusion, , Intravenous, Continuous, [place], Amy, MD, Last Rate: 75 mL/hr at 03/31/19 1206, New Bag at 03/31/19 1206 Allergies: Patient has no known allergies.  Review of Systems  Constitutional: Negative for chills, fever and malaise/fatigue.  HENT: Negative for congestion, sinus pain and sore throat.   Eyes: Negative for blurred vision and pain.  Respiratory: Negative for cough and wheezing.   Cardiovascular: Negative for chest pain and leg swelling.  Gastrointestinal: Negative for abdominal pain, constipation, diarrhea, heartburn, nausea and vomiting.  Genitourinary: Negative for dysuria, frequency, hematuria and urgency.  Musculoskeletal: Negative for back pain, joint pain, myalgias and neck pain.  Skin: Negative for itching and rash.  Neurological: Negative for dizziness, tremors and weakness.  Endo/Heme/Allergies: Does not bruise/bleed easily.  Psychiatric/Behavioral: Negative for depression. The patient is not nervous/anxious and does not have insomnia.     Objective: BP 130/82   Pulse 94   Temp 98.6 F (37 C) (Oral)   Resp 16   Ht 5\' 2"  (1.575 m)   Wt 66.7 kg   LMP  12/18/2018 (Exact Date)   SpO2 100%   Breastfeeding Unknown   BMI 26.89 kg/m   Filed Weights   03/31/19 1146  Weight: 66.7 kg   Physical Exam Constitutional:      General: She is not in acute distress.    Appearance: She is well-developed.  HENT:     Head: Normocephalic and atraumatic. No laceration.     Right Ear: Hearing normal.     Left Ear: Hearing normal.     Mouth/Throat:     Pharynx: Uvula midline.  Eyes:     Pupils: Pupils are equal, round, and reactive to light.  Neck:     Thyroid: No thyromegaly.  Cardiovascular:     Rate and Rhythm: Normal rate and regular rhythm.     Heart sounds: No murmur. No friction rub. No gallop.   Pulmonary:     Effort: Pulmonary effort is normal. No respiratory distress.     Breath sounds: Normal breath sounds. No wheezing.  Chest:     Breasts:        Right: No mass, skin change or tenderness.        Left: No mass, skin change or tenderness.  Abdominal:     General: Bowel sounds are normal. There is no distension.     Palpations: Abdomen is soft.     Tenderness: There is no abdominal tenderness. There is no rebound.  Musculoskeletal:        General: Normal range of motion.     Cervical back: Normal range of motion and neck  supple.  Neurological:     Mental Status: She is alert and oriented to person, place, and time.     Cranial Nerves: No cranial nerve deficit.  Skin:    General: Skin is warm and dry.  Psychiatric:        Judgment: Judgment normal.  Vitals reviewed.     Assessment: Missed abortion Options discussed for D&C vs expectant management Pros and cons, risks,, future pregnancy concerns Plan Suction D&C today  I have had a careful discussion with this patient about all the options available and the risk/benefits of each. I have fully informed this patient that surgery may subject her to a variety of discomforts and risks: She understands that most patients have surgery with little difficulty, but problems can  happen ranging from minor to fatal. These include nausea, vomiting, pain, bleeding, infection, poor healing, hernia, or formation of adhesions. Unexpected reactions may occur from any drug or anesthetic given. Unintended injury may occur to other pelvic or abdominal structures such as Fallopian tubes, ovaries, bladder, ureter (tube from kidney to bladder), or bowel. Nerves going from the pelvis to the legs may be injured. Any such injury may require immediate or later additional surgery to correct the problem. Excessive blood loss requiring transfusion is very unlikely but possible. Dangerous blood clots may form in the legs or lungs. Physical and sexual activity will be restricted in varying degrees for an indeterminate period of time but most often 2-6 weeks.  Finally, she understands that it is impossible to list every possible undesirable effect and that the condition for which surgery is done is not always cured or significantly improved, and in rare cases may be even worse.Ample time was given to answer all questions.  Barnett Applebaum, MD, [facility], [facility] 03/31/2019  12:52 PM

## 2019-03-31 NOTE — OR Nursing (Signed)
Dr Tiburcio Pea called and given lab results and vital signs and assessments.  May discharge home per MD.

## 2019-03-31 NOTE — Anesthesia Preprocedure Evaluation (Signed)
Anesthesia Evaluation  Patient identified by MRN, date of birth, ID band Patient awake    Reviewed: Allergy & Precautions, NPO status , Patient's Chart, lab work & pertinent test results  History of Anesthesia Complications Negative for: history of anesthetic complications  Airway Mallampati: I  TM Distance: >3 FB Neck ROM: Full    Dental no notable dental hx. (+) Teeth Intact   Pulmonary neg pulmonary ROS, neg sleep apnea, neg COPD, Patient abstained from smoking.Not current smoker,    Pulmonary exam normal breath sounds clear to auscultation       Cardiovascular Exercise Tolerance: Good METS(-) hypertension(-) CAD and (-) Past MI negative cardio ROS  (-) dysrhythmias  Rhythm:Regular Rate:Normal - Systolic murmurs    Neuro/Psych negative neurological ROS  negative psych ROS   GI/Hepatic neg GERD  ,(+)     (-) substance abuse  ,   Endo/Other  neg diabetes  Renal/GU negative Renal ROS     Musculoskeletal   Abdominal   Peds  Hematology  (+) anemia ,   Anesthesia Other Findings Past Medical History: No date: Anemia  Reproductive/Obstetrics (+) Pregnancy Positive test, but MISSED ABORTION. Getting D&C for it                             Anesthesia Physical Anesthesia Plan  ASA: I  Anesthesia Plan: General   Post-op Pain Management:    Induction: Intravenous  PONV Risk Score and Plan: 4 or greater and Ondansetron, Dexamethasone and Midazolam  Airway Management Planned: LMA  Additional Equipment: None  Intra-op Plan:   Post-operative Plan: Extubation in OR  Informed Consent: I have reviewed the patients History and Physical, chart, labs and discussed the procedure including the risks, benefits and alternatives for the proposed anesthesia with the patient or authorized representative who has indicated his/her understanding and acceptance.     Dental advisory given  Plan  Discussed with: CRNA and Surgeon  Anesthesia Plan Comments: (Discussed risks of anesthesia with patient, including PONV, sore throat, lip/dental damage. Rare risks discussed as well, such as cardiorespiratory sequelae. Patient understands.)        Anesthesia Quick Evaluation

## 2019-03-31 NOTE — Discharge Instructions (Signed)
AMBULATORY SURGERY  DISCHARGE INSTRUCTIONS   1) The drugs that you were given will stay in your system until tomorrow so for the next 24 hours you should not:  A) Drive an automobile B) Make any legal decisions C) Drink any alcoholic beverage   2) You may resume regular meals tomorrow.  Today it is better to start with liquids and gradually work up to solid foods.  You may eat anything you prefer, but it is better to start with liquids, then soup and crackers, and gradually work up to solid foods.   3) Please notify your doctor immediately if you have any unusual bleeding, trouble breathing, redness and pain at the surgery site, drainage, fever, or pain not relieved by medication.    4) Additional Instructions:        Please contact your physician with any problems or Same Day Surgery at [phone], Monday through Friday 6 am to 4 pm, or [place] [facility] number at [phone]. Dilation and Curettage or Vacuum Curettage, Care After These instructions give you information about caring for yourself after your procedure. Your doctor may also give you more specific instructions. Call your doctor if you have any problems or questions after your procedure. Follow these instructions at home: Activity  Do not drive or use heavy machinery while taking prescription pain medicine.  For 24 hours after your procedure, avoid driving.  Take short walks often, followed by rest periods. Ask your doctor what activities are safe for you. After one or two days, you may be able to return to your normal activities.  Do not lift anything that is heavier than 10 lb (4.5 kg) until your doctor approves.  For at least 2 weeks, or as long as told by your doctor: ? Do not douche. ? Do not use tampons. ? Do not have sex. General instructions   Take over-the-counter and prescription medicines only as told by your doctor. This is very important if you take blood thinning  medicine.  Do not take baths, swim, or use a hot tub until your doctor approves. Take showers instead of baths.  Wear compression stockings as told by your doctor.  It is up to you to get the results of your procedure. Ask your doctor when your results will be ready.  Keep all follow-up visits as told by your doctor. This is important. Contact a doctor if:  You have very bad cramps that get worse or do not get better with medicine.  You have very bad pain in your belly (abdomen).  You cannot drink fluids without throwing up (vomiting).  You get pain in a different part of the area between your belly and thighs (pelvis).  You have bad-smelling discharge from your vagina.  You have a rash. Get help right away if:  You are bleeding a lot from your vagina. A lot of bleeding means soaking more than one sanitary pad in an hour, for 2 hours in a row.  You have clumps of blood (blood clots) coming from your vagina.  You have a fever or chills.  Your belly feels very tender or hard.  You have chest pain.  You have trouble breathing.  You cough up blood.  You feel dizzy.  You feel light-headed.  You pass out (faint).  You have pain in your neck or shoulder area. Summary  Take short walks often, followed by rest periods. Ask your doctor what activities are safe for you. After one or two days,   you may be able to return to your normal activities.  Do not lift anything that is heavier than 10 lb (4.5 kg) until your doctor approves.  Do not take baths, swim, or use a hot tub until your doctor approves. Take showers instead of baths.  Contact your doctor if you have any symptoms of infection, like bad-smelling discharge from your vagina. This information is not intended to replace advice given to you by your health care provider. Make sure you discuss any questions you have with your health care provider. Document Revised: 02/13/2017 Document Reviewed: 11/19/2015 Elsevier  Patient Education  2020 [employer] Inc.  

## 2019-03-31 NOTE — Anesthesia Procedure Notes (Signed)
Procedure Name: LMA Insertion Date/Time: 03/31/2019 1:26 PM Performed by: Edmund Hilda, CRNA Pre-anesthesia Checklist: Patient identified, Patient being monitored, Timeout performed, Emergency Drugs available and Suction available Patient Re-evaluated:Patient Re-evaluated prior to induction Oxygen Delivery Method: Circle system utilized Preoxygenation: Pre-oxygenation with 100% oxygen Induction Type: IV induction Ventilation: Mask ventilation without difficulty LMA: LMA inserted LMA Size: 4.0 Tube type: Oral Number of attempts: 1 Placement Confirmation: positive ETCO2 and breath sounds checked- equal and bilateral Tube secured with: Tape Dental Injury: Teeth and Oropharynx as per pre-operative assessment

## 2019-03-31 NOTE — Transfer of Care (Signed)
Immediate Anesthesia Transfer of Care Note  Patient: Kaitlyn Steele  Procedure(s) Performed: DILATATION AND EVACUATION (N/A )  Patient Location: PACU  Anesthesia Type:General  Level of Consciousness: awake, alert  and oriented  Airway & Oxygen Therapy: Patient Spontanous Breathing and Patient connected to face mask oxygen  Post-op Assessment: Report given to RN, Post -op Vital signs reviewed and stable and Patient moving all extremities  Post vital signs: Reviewed and stable  Last Vitals:  Vitals Value Taken Time  BP 115/67 03/31/19 1421  Temp    Pulse 96 03/31/19 1421  Resp 16 03/31/19 1421  SpO2 100 % 03/31/19 1421    Last Pain:  Vitals:   03/31/19 1146  TempSrc: Oral  PainSc: 0-No pain         Complications: No apparent anesthesia complications

## 2019-03-31 NOTE — Op Note (Signed)
  Operative Note  03/31/2019 2:09 PM  PRE-OP DIAGNOSIS: Missed abortion   POST-OP DIAGNOSIS: same  SURGEON: Annamarie Major, MD, FACOG  ANESTHESIA: Choice   PROCEDURE: Procedure(s): DILATATION AND EVACUATION   ESTIMATED BLOOD LOSS: 1000 mL   SPECIMENS: POC   COMPLICATIONS: none  DISPOSITION: PACU - hemodynamically stable.  CONDITION: stable  FINDINGS: Exam under anesthesia revealed a 10 wk size uterus without palpable adnexal masses.   INDICATION FOR PROCEDURE: Missed abortion, several weeks, [place] still showing 9 week sized IUP w no FHT  PROCEDURE IN DETAIL: After informed consent was obtained, the patient was taken to the operating room where anesthesia was obtained without difficulty. The patient was positioned in the dorsal lithotomy position with [employer]. Time out was performed and an exam under anesthesia was performed. The vagina, perineum, and lower abdomen were prepped and draped in a normal sterile fashion. The bladder was emptied with an I&O catheter. A speculum was placed into the vagina and the cervix was grasped with a single toothed tenaculum. The uterus was sounded to 16cm.  The cervix was gently dilated to 20 [place] with  [employer] dilators. The suction was then tested and found to be adequate, and a 64mm rigid suction cannula was advanced into the uterine cavity. The suction was activated and the contents of the uterus were aspirated until no further tissue was obtained. The uterus was then curetted to gritty texture throughout.  At the end of the procedure bleeding was noted to be moderate and the amount of tissue considered large.  All instruments were then removed from the vagina.The patient tolerated the procedure well. All sponge, instrument, and needle counts were correct. The patient was taken to the recovery room in good condition.   Annamarie Major, MD, [facility], [facility] 03/31/2019  2:09 PM

## 2019-04-01 NOTE — Anesthesia Postprocedure Evaluation (Signed)
Anesthesia Post Note  Patient: Kaitlyn Steele  Procedure(s) Performed: DILATATION AND EVACUATION (N/A )  Anesthesia Type: General     Last Vitals:  Vitals:   03/31/19 1548 03/31/19 1623  BP: 114/72 112/69  Pulse: 86 90  Resp: 16   Temp: 36.6 C   SpO2: 100% 100%    Last Pain:  Vitals:   04/01/19 0812  TempSrc:   PainSc: 6                  Corinda Gubler

## 2019-04-04 LAB — SURGICAL PATHOLOGY

## 2019-04-13 ENCOUNTER — Other Ambulatory Visit: Payer: Self-pay

## 2019-04-13 ENCOUNTER — Encounter: Payer: Self-pay | Admitting: Obstetrics & Gynecology

## 2019-04-13 ENCOUNTER — Ambulatory Visit (INDEPENDENT_AMBULATORY_CARE_PROVIDER_SITE_OTHER): Payer: Medicaid Other | Admitting: Obstetrics & Gynecology

## 2019-04-13 VITALS — BP 120/80 | Ht 62.0 in | Wt 142.0 lb

## 2019-04-13 DIAGNOSIS — Z9889 Other specified postprocedural states: Secondary | ICD-10-CM

## 2019-04-13 MED ORDER — FERROUS SULFATE 325 (65 FE) MG PO TABS: 325.0000 mg | ORAL_TABLET | Freq: Two times a day (BID) | ORAL | 1 refills | Status: DC

## 2019-04-13 NOTE — Progress Notes (Signed)
  Postoperative Follow-up Patient presents post op from [facility] for MAb, 2 weeks ago.  Subjective: Patient reports marked improvement in her preop symptoms. Eating a regular diet without difficulty. The patient is not having any pain.  Activity: normal activities of daily living. Patient reports additional symptom's since surgery of None.  Objective: BP 120/80   Ht 5\' 2"  (1.575 m)   Wt 142 lb (64.4 kg)   LMP 12/18/2018 (Exact Date) Comment: normal  BMI 25.97 kg/m  Physical Exam Constitutional:      General: She is not in acute distress.    Appearance: She is well-developed.  Musculoskeletal:        General: Normal range of motion.  Neurological:     Mental Status: She is alert and oriented to person, place, and time.  Skin:    General: Skin is warm and dry.  Vitals reviewed.     Assessment: s/p :  D&C progressing well  Plan: Patient has done well after surgery with no apparent complications.  I have discussed the post-operative course to date, and the expected progress moving forward.  The patient understands what complications to be concerned about.  I will see the patient in routine follow up, or sooner if needed.    Activity plan: No restriction.  Advised as to contraception options, she desires none at this time. This was her second miscarriage, yet no other evidence for recurrent Ab.  OK to try for pregnancy anytime she wants.  Annual due in Dec 2021  Jan 2022 04/13/2019, 10:06 AM

## 2019-11-22 IMAGING — US US OB COMP LESS 14 WK
1 series · 13 of 28 positions shown · non-contrast
Comparison: None.

Addendum:
CLINICAL DATA: Vaginal bleeding.  Abdominal pain.

EXAM:
OBSTETRIC <14 WK ULTRASOUND
TECHNIQUE: Transabdominal ultrasound was performed for evaluation of the
gestation as well as the maternal uterus and adnexal regions.

[Series 1: us ob comp less 14 wk · 34 acquisitions, 13 frames shown]
[im 2/34]
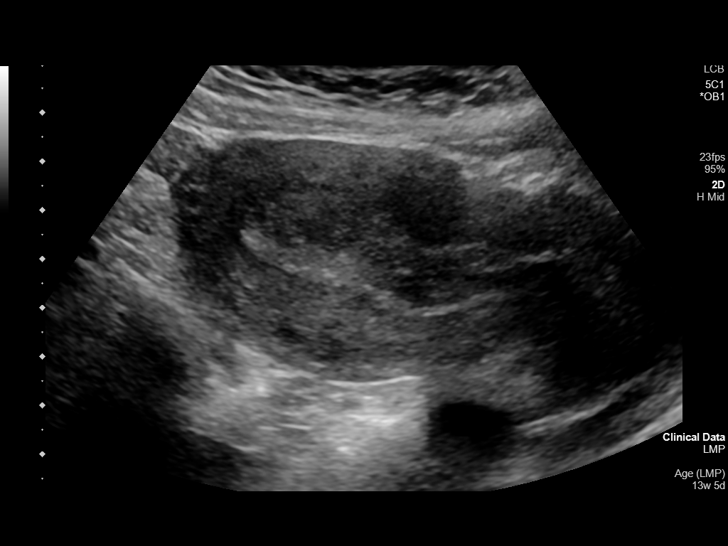
[im 4/34]
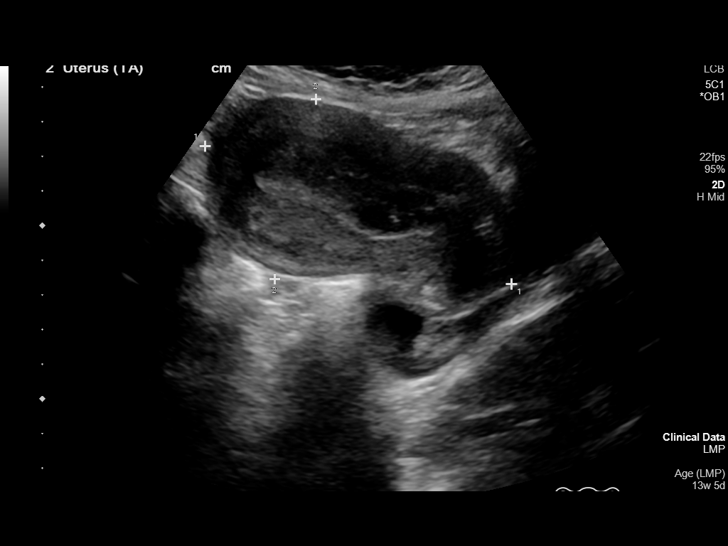
[im 7/34]
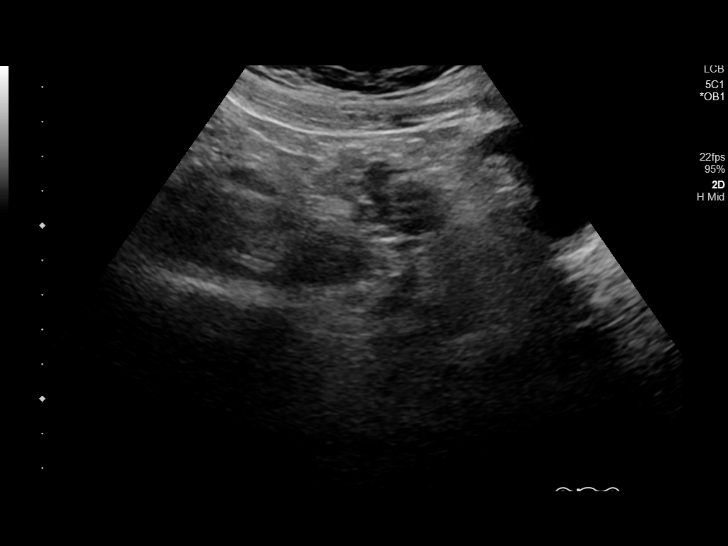
[im 9/34]
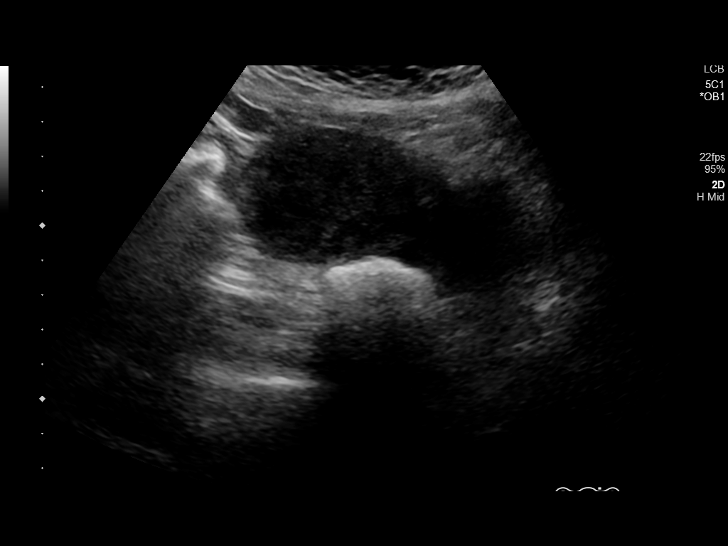
[im 12/34]
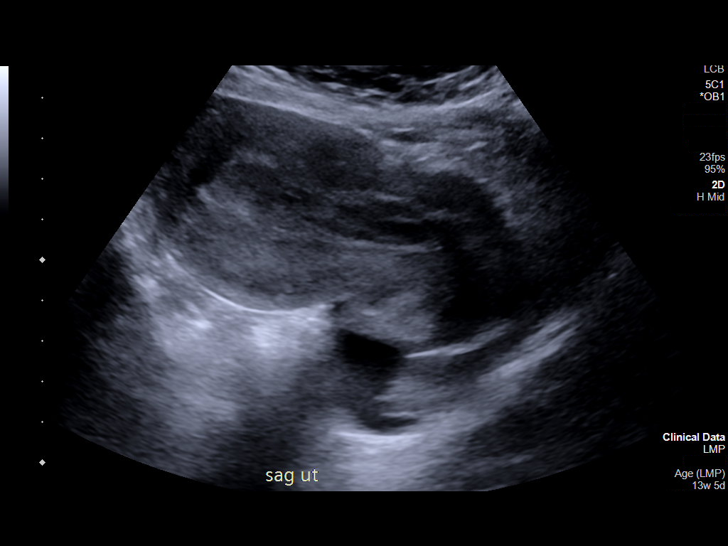
[im 14/34]
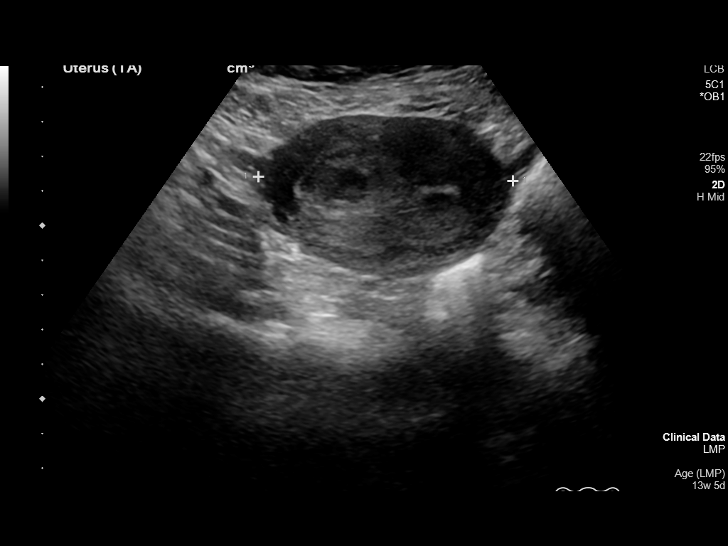
[im 18/34]
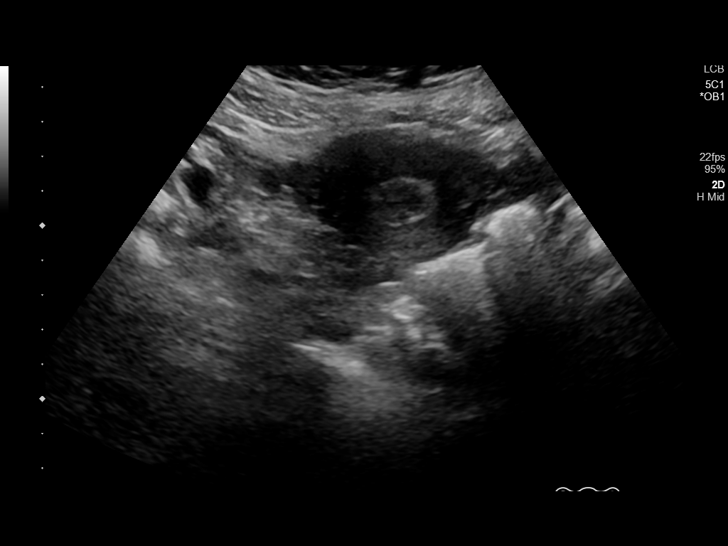
[im 20/34]
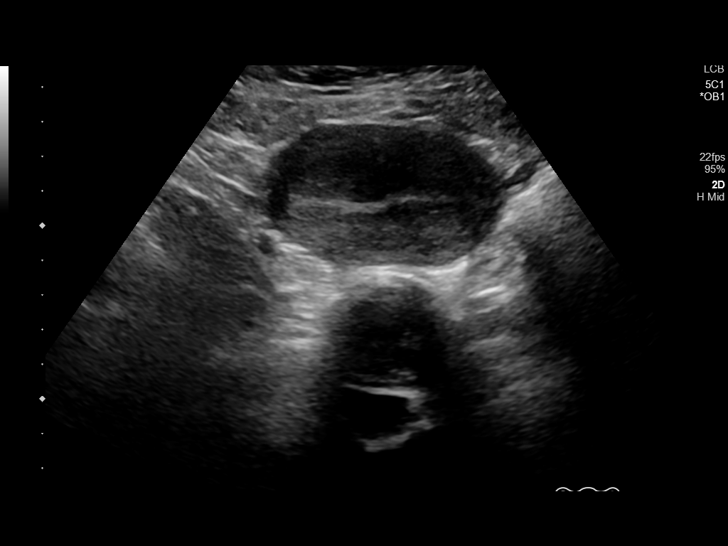
[im 23/34]
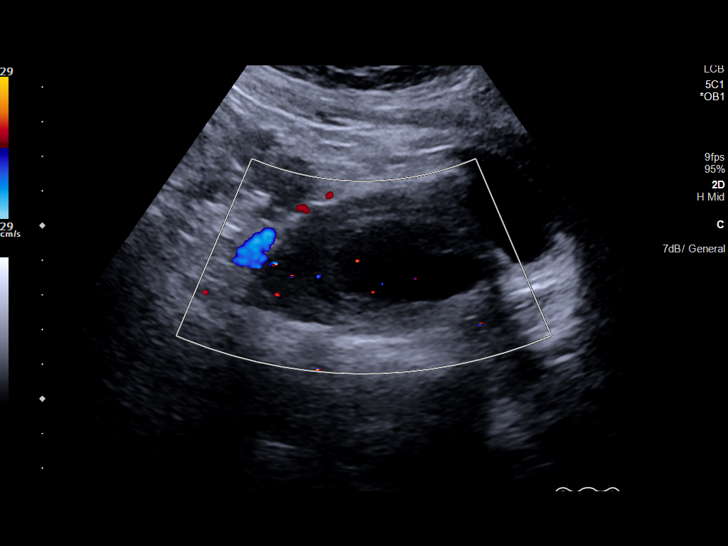
[im 25/34]
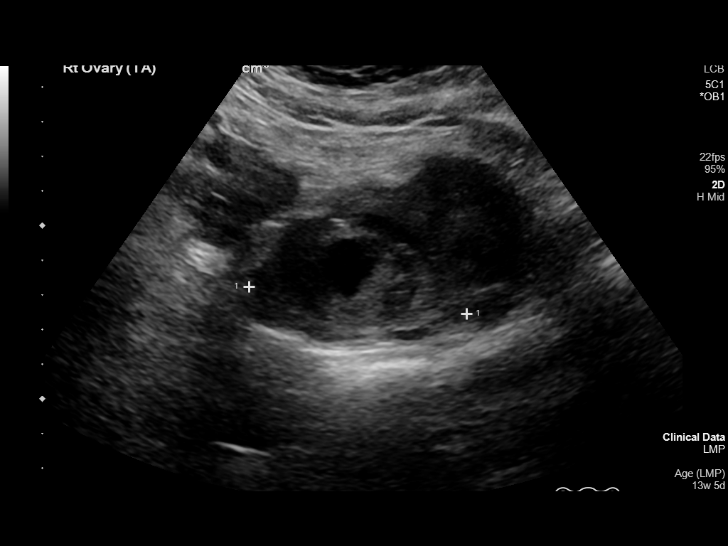
[im 27/34]
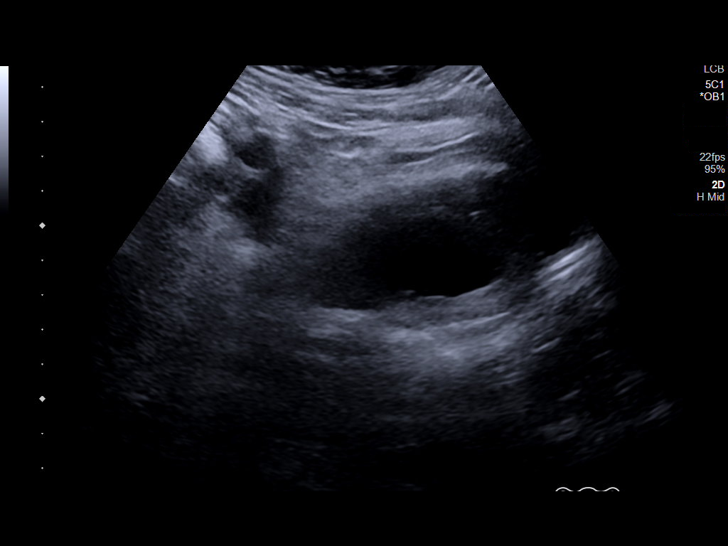
[im 30/34]
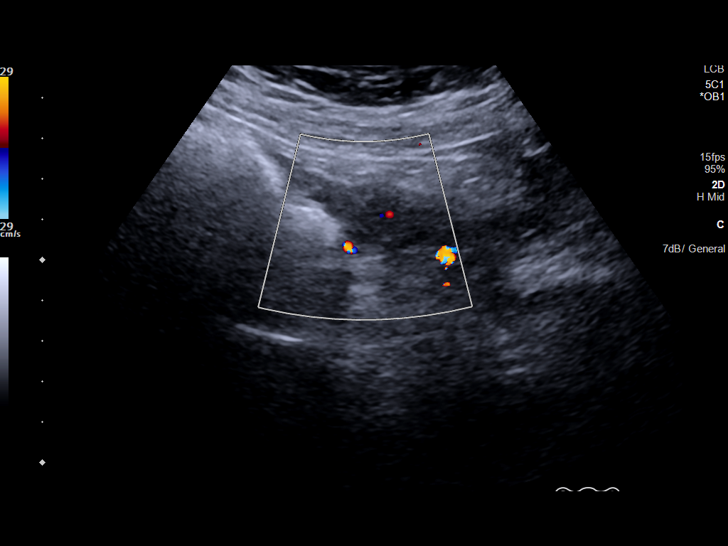
[im 32/34]
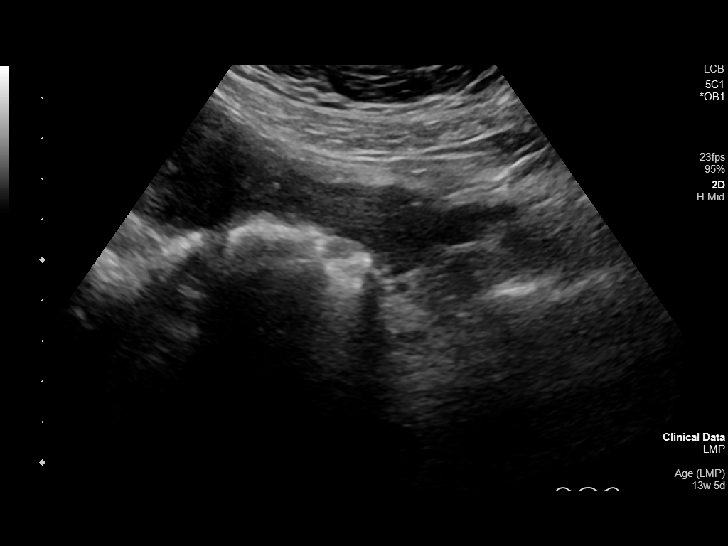

[13 of 28 positions shown; findings below may reference images not displayed]

FINDINGS: Intrauterine gestational sac: None

Maternal uterus/adnexae: The uterus measures 9.7 x 5.3 x 7.8 cm with
a volume of 198 mL.

A mass measuring 7.1 x 4.2 x 6.3 cm and containing a dominant cystic
mass measuring 4.7 x 2.5 x 3.5 cm in the right adnexa is favored to
be the ovary. A separate right adnexal mass is not noted.

The left ovary measures 3.7 x 1.6 x 2.8 cm.

No free fluid in the pelvis.
IMPRESSION: 1. No IUP is identified. The absence of an IUP in the setting of a
positive beta HCG could represent an unidentified ectopic pregnancy,
recent miscarriage, or early pregnancy. Recommend clinical
correlation and close follow-up.
2. The 7.1 x 4.2 x 6.3 cm mass in the right adnexa containing a
cm dominant cyst is favored to be the right ovary with a dominant
corpus luteum cyst. A separate adnexal mass or ovary is not seen.
Recommend attention on follow-up. The left ovary is normal.
3. No free fluid in the pelvis.

Of note, endovaginal imaging was not performed and is recommended
for further characterization.

ADDENDUM:
Findings discussed with the ER physician.

*** End of Addendum ***

## 2020-07-19 ENCOUNTER — Other Ambulatory Visit: Payer: Self-pay

## 2020-07-19 ENCOUNTER — Ambulatory Visit (LOCAL_COMMUNITY_HEALTH_CENTER): Payer: Medicaid Other

## 2020-07-19 VITALS — BP 127/79 | Ht 61.0 in | Wt 125.0 lb

## 2020-07-19 DIAGNOSIS — Z3201 Encounter for pregnancy test, result positive: Secondary | ICD-10-CM

## 2020-07-19 LAB — PREGNANCY, URINE: Preg Test, Ur: POSITIVE — AB

## 2020-07-19 MED ORDER — PRENATAL 27-0.8 MG PO TABS: 1.0000 | ORAL_TABLET | Freq: Every day | ORAL | 0 refills | Status: DC

## 2020-07-19 NOTE — Progress Notes (Signed)
UPT positive. Plans prenatal care at [facility]. To DSS for Medicaid/PW. Adamaris King, RN  

## 2020-07-31 ENCOUNTER — Other Ambulatory Visit: Payer: Self-pay

## 2020-07-31 ENCOUNTER — Ambulatory Visit (INDEPENDENT_AMBULATORY_CARE_PROVIDER_SITE_OTHER): Payer: Medicaid Other | Admitting: Obstetrics and Gynecology

## 2020-07-31 ENCOUNTER — Encounter: Payer: Self-pay | Admitting: Obstetrics and Gynecology

## 2020-07-31 ENCOUNTER — Other Ambulatory Visit (HOSPITAL_COMMUNITY)
Admission: RE | Admit: 2020-07-31 | Discharge: 2020-07-31 | Disposition: A | Discharge status: Home / Self Care | Payer: Medicaid Other | Source: Ambulatory Visit | Attending: Obstetrics and Gynecology | Admitting: Obstetrics and Gynecology

## 2020-07-31 VITALS — BP 120/78 | HR 98 | Wt 125.0 lb

## 2020-07-31 DIAGNOSIS — O281 Abnormal biochemical finding on antenatal screening of mother: Secondary | ICD-10-CM

## 2020-07-31 DIAGNOSIS — Z113 Encounter for screening for infections with a predominantly sexual mode of transmission: Secondary | ICD-10-CM | POA: Diagnosis not present

## 2020-07-31 DIAGNOSIS — Z130 Encounter for screening for diseases of the blood and blood-forming organs and certain disorders involving the immune mechanism: Secondary | ICD-10-CM | POA: Diagnosis not present

## 2020-07-31 DIAGNOSIS — Z3481 Encounter for supervision of other normal pregnancy, first trimester: Secondary | ICD-10-CM

## 2020-07-31 DIAGNOSIS — Z3490 Encounter for supervision of normal pregnancy, unspecified, unspecified trimester: Secondary | ICD-10-CM | POA: Insufficient documentation

## 2020-07-31 DIAGNOSIS — Z3149 Encounter for other procreative investigation and testing: Secondary | ICD-10-CM

## 2020-07-31 DIAGNOSIS — Z7185 Encounter for immunization safety counseling: Secondary | ICD-10-CM

## 2020-07-31 DIAGNOSIS — Z3A12 12 weeks gestation of pregnancy: Secondary | ICD-10-CM

## 2020-07-31 NOTE — Progress Notes (Signed)
New Obstetric Patient H&P    Chief Complaint: Missed period, +UPT   History of Present Illness: Patient is a 25 y.o. G21P1011 Hispanic or Latino female, presents with amenorrhea and positive home pregnancy test. Patient's last menstrual period was 05/07/2020. and based on her  LMP, her EDD is Estimated Date of Delivery: 02/11/21 and her EGA is [redacted]w[redacted]d. Cycles are 3. days, regular, and occur approximately every : 28 days. Her last pap smear was 2 years ago and was NILM.    She had a urine pregnancy test which was positive 2 or 3 week(s)  ago. Her last menstrual period was normal and lasted for  3 day(s). Since her LMP she claims she has experienced an increase in fatigue, breast tenderness, and nausea. She denies vaginal bleeding. Her past medical history is noncontributory. Her prior pregnancies are notable for G1 - term NSVD complicated by anemia. Pelvis proven to 7lb 9oz.  Since her LMP, she admits to the use of tobacco products  no She reports she has lost 5  pounds since the start of her pregnancy.  There are cats in the home in the home  no  She admits close contact with children on a regular basis  yes  She has had chicken pox in the past no She has had Tuberculosis exposures, symptoms, or previously tested positive for TB   no Current or past history of domestic violence. no  Genetic Screening/Teratology Counseling: (Includes patient, baby's father, or anyone in either family with:)   1. Patient's age >/= 56 at [facility]  no 2. Thalassemia ([place], [place], Mediterranean, or Asian background): MCV<80  no 3. Neural tube defect (meningomyelocele, spina bifida, anencephaly)  no 4. Congenital heart defect  no  5. Down syndrome  no 6. Tay-Sachs (Jewish, [place]  no 7. Canavan's Disease  no 8. Sickle cell disease or trait (African)  no  9. Hemophilia or other blood disorders  no  10. Muscular dystrophy  no  11. Cystic fibrosis  no  12. Huntington's Chorea  no  13. Mental  retardation/autism  no 14. Other inherited genetic or chromosomal disorder  no 15. Maternal metabolic disorder (DM, PKU, etc)  no 16. Patient or FOB with a child with a birth defect not listed above no  16a. Patient or FOB with a birth defect themselves no 17. Recurrent pregnancy loss, or stillbirth  no  18. Any medications since LMP other than prenatal vitamins (include vitamins, supplements, OTC meds, drugs, alcohol)  no 19. Any other genetic/environmental exposure to discuss  no  Infection History:   1. Lives with someone with TB or TB exposed  no  2. Patient or partner has history of genital herpes  no 3. Rash or viral illness since LMP  no 4. History of STI (GC, CT, HPV, syphilis, HIV)  no 5. History of recent travel :  no  Other pertinent information:  Lives with mother and 13 year-old son. BF will be involved in [facility]. Currently employed at a warehouse.   Review of Systems:10 point review of systems negative unless otherwise noted in HPI  Past Medical History:  Patient Active Problem List   Diagnosis Date Noted  . Supervision of normal pregnancy 07/31/2020     Nursing Staff Provider  Office Location  [facility] Dating   LMP  Language  English Anatomy US    Flu Vaccine   declined Genetic Screen  NIPS:  desires  TDaP vaccine    Hgb A1C or  GTT  Early : n/a Third trimester :   Rhogam   n/a   LAB RESULTS   Feeding Plan  breast Blood Type     Contraception  Antibody    Circumcision  Rubella    Pediatrician   RPR     Support Person  Mom Evelene Croon) and BF HBsAg     Prenatal Classes  HIV      Varicella [email or web address] @   BTL Consent  n/a GBS  (For PCN allergy, check sensitivities)        VBAC Consent  n/a Pap  12/20 - NILM    Hgb Electro   considering    CF  considering     SMA  considering             Past Surgical History:  Past Surgical History:  Procedure Laterality Date  . DILATION AND EVACUATION N/A 03/31/2019   Procedure: DILATATION AND EVACUATION;   Surgeon: 04/02/2019, MD;  Location: [facility] ORS;  Service: Gynecology;  Laterality: N/A;    Gynecologic History: Patient's last menstrual period was 05/07/2020.  Obstetric History: 05/09/2020  Family History:  Family History  Problem Relation Age of Onset  . Hypertension Father     Social History:  Social History   Socioeconomic History  . Marital status: Single    Spouse name: Not on file  . Number of children: Not on file  . Years of education: Not on file  . Highest education level: Not on file  Occupational History  . Not on file  Tobacco Use  . Smoking status: Never Smoker  . Smokeless tobacco: Never Used  Vaping Use  . Vaping Use: Never used  Substance and Sexual Activity  . Alcohol use: Never  . Drug use: Never  . Sexual activity: Yes    Comment: hx depo and ocp, stopped ocp 2018  Other Topics Concern  . Not on file  Social History Narrative  . Not on file   Social Determinants of Health   Financial Resource Strain: Not on file  Food Insecurity: Not on file  Transportation Needs: Not on file  Physical Activity: Not on file  Stress: Not on file  Social Connections: Not on file  Intimate Partner Violence: Not At Risk  . Fear of Current or Ex-Partner: No  . Emotionally Abused: No  . Physically Abused: No  . Sexually Abused: No    Allergies:  No Known Allergies  Medications: Prior to Admission medications   Not on File    Physical Exam Vitals: Blood pressure 120/78, pulse 98, weight 125 lb (56.7 kg), last menstrual period 05/07/2020.  General: NAD HEENT: normocephalic, anicteric Thyroid: no enlargement, no palpable nodules Pulmonary: No increased work of breathing, CTAB Cardiovascular: RRR, distal pulses 2+ Abdomen: NABS, soft, non-tender, non-distended.  Umbilicus without lesions.  No hepatomegaly, splenomegaly or masses palpable. No evidence of hernia  - FHT - 186 Genitourinary:  External: Normal external female genitalia.  Normal urethral  meatus, normal  Bartholin's and Skene's glands.    Vagina: Normal vaginal mucosa, no evidence of prolapse.    Cervix: Grossly normal in appearance, no bleeding  Uterus: Enlarged (consistent with 10-12 week dates), mobile, normal contour.  No CMT  Adnexa: ovaries non-enlarged, no adnexal masses  Rectal: deferred Extremities: no edema, erythema, or tenderness Neurologic: Grossly intact Psychiatric: mood appropriate, affect full   Assessment: 25 y.o. G4P1011 at [redacted]w[redacted]d presenting to initiate prenatal care  Plan: 1) Avoid alcoholic beverages. 2) Patient encouraged not to smoke.  3) Discontinue the use of all non-medicinal drugs and chemicals.  4) Take prenatal vitamins daily.  5) Nutrition, food safety (fish, cheese advisories, and high nitrite foods) and exercise discussed. 6) Hospital and practice style discussed with cross coverage system.  7) Genetic Screening, such as with 1st Trimester Screening, cell free fetal DNA, AFP testing, and Ultrasound, as well as with amniocentesis and CVS as appropriate, is discussed with patient. At the conclusion of today's visit patient requested genetic testing - Patient desires NIPS at next visit - after dating [place] - she is considering Inheritest and sickle cell screening - information providied  8) NOB labs/urine/GC/CT collected today  9) Return in about 1 week (around 08/07/2020) for ROB and dating US/NIPS with MD - (first available).   Zipporah Plants, CNM, MSN [facility], [facility] 07/31/2020, 9:22 AM

## 2020-08-01 LAB — RPR+RH+ABO+RUB AB+AB SCR+CB...
Antibody Screen: NEGATIVE
HIV Screen 4th Generation wRfx: NONREACTIVE
Hematocrit: 28.3 % — ABNORMAL LOW (ref 34.0–46.6)
Hemoglobin: 7.2 g/dL — ABNORMAL LOW (ref 11.1–15.9)
Hepatitis B Surface Ag: NEGATIVE
MCH: 15 pg — ABNORMAL LOW (ref 26.6–33.0)
MCHC: 25.4 g/dL — ABNORMAL LOW (ref 31.5–35.7)
MCV: 59 fL — ABNORMAL LOW (ref 79–97)
Platelets: 288 10*3/uL (ref 150–450)
RBC: 4.8 x10E6/uL (ref 3.77–5.28)
RDW: 21.4 % — ABNORMAL HIGH (ref 11.7–15.4)
RPR Ser Ql: NONREACTIVE
Rh Factor: POSITIVE
Rubella Antibodies, IGG: <0.9 index — ABNORMAL LOW (ref >0.99)
Varicella zoster IgG: <135 index — ABNORMAL LOW (ref >165)
WBC: 5.6 10*3/uL (ref 3.4–10.8)

## 2020-08-01 LAB — CERVICOVAGINAL ANCILLARY ONLY
Chlamydia: NEGATIVE
Comment: NEGATIVE
Comment: NEGATIVE
Comment: NORMAL
Neisseria Gonorrhea: NEGATIVE
Trichomonas: NEGATIVE

## 2020-08-04 LAB — URINE CULTURE

## 2020-08-07 ENCOUNTER — Other Ambulatory Visit: Payer: Self-pay | Admitting: Obstetrics and Gynecology

## 2020-08-09 ENCOUNTER — Telehealth: Payer: Self-pay | Admitting: Obstetrics and Gynecology

## 2020-08-09 NOTE — Progress Notes (Signed)
Attempted to call patient via phone to review labs from NOB. Utilized mobile and home phone numbers as documented on chart. No answer, no voicemail set up. Called both numbers twice. Will attempt to call back.   Zipporah Plants, CNM [facility], [facility] 08/07/2020 15:50 PM

## 2020-08-09 NOTE — Telephone Encounter (Signed)
Attempted to call patient via phone regarding results from NOB labs. No answer at mobile or home phone numbers listed on chart. No voicemail setup. Attempted both numbers x2. Patient with ROB scheduled for tomorrow - will notified provider regarding results.  Zipporah Plants, CNM [facility], [facility] 08/09/2020 8:34 PM

## 2020-08-10 ENCOUNTER — Encounter: Payer: Self-pay | Admitting: Obstetrics and Gynecology

## 2020-08-10 ENCOUNTER — Other Ambulatory Visit: Payer: Self-pay

## 2020-08-10 ENCOUNTER — Ambulatory Visit (INDEPENDENT_AMBULATORY_CARE_PROVIDER_SITE_OTHER): Payer: Medicaid Other | Admitting: Obstetrics and Gynecology

## 2020-08-10 VITALS — BP 100/60 | Wt 126.0 lb

## 2020-08-10 DIAGNOSIS — O99019 Anemia complicating pregnancy, unspecified trimester: Secondary | ICD-10-CM | POA: Insufficient documentation

## 2020-08-10 DIAGNOSIS — O021 Missed abortion: Secondary | ICD-10-CM | POA: Diagnosis not present

## 2020-08-10 DIAGNOSIS — Z3481 Encounter for supervision of other normal pregnancy, first trimester: Secondary | ICD-10-CM | POA: Diagnosis not present

## 2020-08-10 DIAGNOSIS — O2340 Unspecified infection of urinary tract in pregnancy, unspecified trimester: Secondary | ICD-10-CM | POA: Insufficient documentation

## 2020-08-10 LAB — POCT URINALYSIS DIPSTICK OB
Glucose, UA: NEGATIVE
POC,PROTEIN,UA: NEGATIVE

## 2020-08-10 NOTE — Progress Notes (Signed)
ULTRASOUND REPORT  Location: [facility] Date of Service: 08/10/2020   Indications:dating Findings:  Mason Jim intrauterine pregnancy is visualized with a CRL consistent with [redacted]w[redacted]d gestation, giving an (U/S) EDD of 02/19/2021. The (U/S) EDD is not consistent with the clinically established EDD of 02/11/2021.  FHR: 0 BPM (no fetal heart rate detected) CRL measurement: 5.9 cm Yolk sac is visualized and appears normal. Amnion: visualized and appears normal   Right Ovary is normal in appearance. Left Ovary is not normal appearance. Corpus luteal cyst:  is not visualized Survey of the adnexa demonstrates no adnexal masses. There is no free peritoneal fluid in the cul de sac.  Impression: 1. [redacted]w[redacted]d Non-Viable Singleton Intrauterine pregnancy by U/S. 2. (U/S) EDD is not consistent with Clinically established EDD of 02/11/2021. 3. Missed abortion diagnosed. See Clinic note for details.   The ultrasound was performed by me and images and findings were reviewed by me and I agree with the above report.  Thomasene Mohair, MD, [facility], [facility] 08/10/2020 6:01 PM

## 2020-08-10 NOTE — Progress Notes (Signed)
Routine Prenatal Care Visit  Subjective  Kaitlyn Steele is a 25 y.o. G4P1011 at [redacted]w[redacted]d being seen today for ongoing prenatal care.  She is currently monitored for the following issues for this high-risk pregnancy and has Supervision of normal pregnancy; Anemia of mother in pregnancy; and UTI (urinary tract infection) during pregnancy on their problem list.  ----------------------------------------------------------------------------------- Patient reports no complaints.    . Vag. Bleeding: None.   . Leaking Fluid denies.  ----------------------------------------------------------------------------------- The following portions of the patient's history were reviewed and updated as appropriate: allergies, current medications, past family history, past medical history, past social history, past surgical history and problem list. Problem list updated.  Objective  Blood pressure 100/60, weight 126 lb (57.2 kg), last menstrual period 05/07/2020. Pregravid weight 130 lb (59 kg) Total Weight Gain -4 lb (-1.814 kg) Urinalysis: Urine Protein Negative  Urine Glucose Negative  Fetal Status: Fetal Heart Rate (bpm): 0         General:  Alert, oriented and cooperative. Patient is in no acute distress.  Skin: Skin is warm and dry. No rash noted.   Cardiovascular: Normal heart rate noted  Respiratory: Normal respiratory effort, no problems with respiration noted  Abdomen: Soft, gravid, appropriate for gestational age.       Pelvic:  Cervical exam deferred        Extremities: Normal range of motion.     Mental Status: Normal mood and affect. Normal behavior. Normal judgment and thought content.   Assessment   25 y.o. G2E3662 at [redacted]w[redacted]d by  02/11/2021, by Last Menstrual Period presenting for routine prenatal visit  Plan   Pregnacy 4 Problems (from 07/31/20 to present)    Problem Noted Resolved   Anemia of mother in pregnancy 08/10/2020 by Conard Novak, MD No   UTI (urinary tract infection) during  pregnancy 08/10/2020 by Conard Novak, MD No   Supervision of normal pregnancy 07/31/2020 by Zipporah Plants, CNM No   Overview Signed 07/31/2020  9:20 AM by Zipporah Plants, CNM     Nursing Staff Provider  Office Location  [facility] Dating   LMP  Language  English Anatomy US    Flu Vaccine   declined Genetic Screen  NIPS:  desires  TDaP vaccine    Hgb A1C or  GTT Early : n/a Third trimester :   Rhogam   n/a   LAB RESULTS   Feeding Plan  breast Blood Type     Contraception  Antibody    Circumcision  Rubella    Pediatrician   RPR     Support Person  Mom Evelene Croon) and BF HBsAg     Prenatal Classes  HIV      Varicella [email or web address] @   BTL Consent  n/a GBS  (For PCN allergy, check sensitivities)        VBAC Consent  n/a Pap  12/20 - NILM    Hgb Electro   considering    CF  considering     SMA  considering                  Preterm labor symptoms and general obstetric precautions including but not limited to vaginal bleeding, contractions, leaking of fluid and fetal movement were reviewed in detail with the patient. Please refer to After Visit Summary for other counseling recommendations.   Transabdominal ultrasound confirms missed abortion with CRL 5.9 cm, consistent with GA of [redacted]w[redacted]d.    Condolences were offered to the patient and her family.  I stressed that  while emotionally difficult, that this did not occur because of an actions or inactions by the patient.  Somewhere between 10-20% of identified first trimester pregnancies will unfortunately end in miscarriage.  Given this relatively high incidence rate, further diagnostic testing such as chromosome analysis is generally not clinically relevant nor recommended.  Although the chromosomal abnormalities have been implicated at rates as high as 70% in some studies, these are generally random and do not infer and increased risk of recurrence with subsequent pregnancies.  However, 3 or more consecutive first trimester losses are  relatively uncommon, and these patient generally do benefit from additional work up to determine a potential modifiable etiology.   We briefly discussed management options including expectant management, medical management, and surgical management as well as their relative success rates and complications. Approximately 80% of first trimester miscarriages will pass successfully but may require a time frame of up to 8 weeks (ACOG Practice Bulletin 150 May 2015 "Early Pregnancy Loss").  Medical management using of misoprostil administered every 3-hrs as needed for up to 3 doses speeds up the time frame to completion significantly, has literature supporting its use up to 63 days or [redacted]w[redacted]d gestation and results in a passage rate of 84-85% (ACOG Practice Bulletin 143 March 2014 "Medical Management of First-Trimester Abortion").  Dilation and curettage has the highest rate of uterine evacuation, but carries with is operative cost, surgical and anesthetic risk.  While these risk are relatively small they nevertheless include infection, bleeding, uterine perforation, formation of uterine synechia, and in rare cases death.   We discussed repeat ultrasound and or trending HCG levels if the patient wishes to pursue these prior to making her decision.  Clinically I am confident of the diagnosis, but I do not want any doubts in the patient's mind regarding the plan of management she chooses to adopt.  I will allow the patient and her family to discuss management options and she was advised to contact the office to arrange final disposition one she has made her decision or should she have any follow up questions for myself.    The patient is Rh positive and rhogam is therefore not indicated.    She opts to return next week for further confirmation.    I discussed her anemia with her and my concern for her the need for blood products with any method of passing this pregnancy (spontaneous, medically induced, surgical).   Given her severe anemia, a controlled treatment with surgical management may be most appropriate.  She may also have a significant increase in hemorrhage with the measurements of the fetus.  She voiced understanding and will follow-up next week with a physician to repeat the ultrasound.  Return in about 4 days (around 08/14/2020) for Follow up SAB management with MD only per SDJ.   Thomasene Mohair, MD, [facility], [facility] 08/10/2020 5:54 PM

## 2020-08-15 ENCOUNTER — Other Ambulatory Visit: Payer: Self-pay

## 2020-08-15 ENCOUNTER — Encounter: Payer: Self-pay | Admitting: Obstetrics & Gynecology

## 2020-08-15 ENCOUNTER — Ambulatory Visit (INDEPENDENT_AMBULATORY_CARE_PROVIDER_SITE_OTHER): Payer: Medicaid Other | Admitting: Obstetrics & Gynecology

## 2020-08-15 VITALS — BP 100/60 | Wt 123.0 lb

## 2020-08-15 DIAGNOSIS — O021 Missed abortion: Secondary | ICD-10-CM | POA: Diagnosis not present

## 2020-08-15 DIAGNOSIS — O99011 Anemia complicating pregnancy, first trimester: Secondary | ICD-10-CM

## 2020-08-15 NOTE — Progress Notes (Signed)
   PRE-OPERATIVE HISTORY AND PHYSICAL EXAM  HPI:  Kaitlyn Steele is a 25 y.o. G4P1011 Patient's last menstrual period was 05/07/2020.; she is being admitted for surgery related to missed abortion.  Pt is 14 weeks by dats however was noted to have a fetal demise by ultrasound one week ago, and it is confirmed by US again today.  Pt has severe anemia.  Pt has h/o prior miscarriage 03/2019 requiring D&C (also late first trimester miscarriage).  No recent bleeding or pain.  Denies dizziness from anemia.  PMHx: Past Medical History:  Diagnosis Date  . Anemia    Past Surgical History:  Procedure Laterality Date  . DILATION AND EVACUATION N/A 03/31/2019   Procedure: DILATATION AND EVACUATION;  Surgeon: Eylin Pontarelli P, MD;  Location: [facility] ORS;  Service: Gynecology;  Laterality: N/A;   Family History  Problem Relation Age of Onset  . Hypertension Father    Social History   Tobacco Use  . Smoking status: Never Smoker  . Smokeless tobacco: Never Used  Vaping Use  . Vaping Use: Never used  Substance Use Topics  . Alcohol use: Never  . Drug use: Never   No current outpatient medications on file. Allergies: Patient has no known allergies.  Review of Systems  Constitutional: Positive for malaise/fatigue. Negative for chills and fever.  HENT: Negative for congestion, sinus pain and sore throat.   Eyes: Negative for blurred vision and pain.  Respiratory: Negative for cough and wheezing.   Cardiovascular: Negative for chest pain and leg swelling.  Gastrointestinal: Negative for abdominal pain, constipation, diarrhea, heartburn, nausea and vomiting.  Genitourinary: Negative for dysuria, frequency, hematuria and urgency.  Musculoskeletal: Negative for back pain, joint pain, myalgias and neck pain.  Skin: Negative for itching and rash.  Neurological: Negative for dizziness, tremors and weakness.  Endo/Heme/Allergies: Does not bruise/bleed easily.  Psychiatric/Behavioral: Negative for  depression. The patient is not nervous/anxious and does not have insomnia.     Objective: BP 100/60   Wt 123 lb (55.8 kg)   LMP 05/07/2020   BMI 23.24 kg/m   Filed Weights   08/15/20 1017  Weight: 123 lb (55.8 kg)   Physical Exam Constitutional:      General: She is not in acute distress.    Appearance: She is well-developed.  Musculoskeletal:        General: Normal range of motion.  Neurological:     Mental Status: She is alert and oriented to person, place, and time.  Skin:    General: Skin is warm and dry.  Vitals reviewed.   Ultrasound- abdominal-   IUP, No FHTs, EGA based on CRL is 12 weeks  Assessment: 1. Anemia during pregnancy in first trimester   2. Missed abortion   Plan D&C  May need transfusion Also, referral to hematology for long term management of anemia  I have had a careful discussion with this patient about all the options available and the risk/benefits of each. I have fully informed this patient that surgery may subject her to a variety of discomforts and risks: She understands that most patients have surgery with little difficulty, but problems can happen ranging from minor to fatal. These include nausea, vomiting, pain, bleeding, infection, poor healing, hernia, or formation of adhesions. Unexpected reactions may occur from any drug or anesthetic given. Unintended injury may occur to other pelvic or abdominal structures such as Fallopian tubes, ovaries, bladder, ureter (tube from kidney to bladder), or bowel. Nerves going from the pelvis to   the legs may be injured. Any such injury may require immediate or later additional surgery to correct the problem. Excessive blood loss requiring transfusion is very unlikely but possible. Dangerous blood clots may form in the legs or lungs. Physical and sexual activity will be restricted in varying degrees for an indeterminate period of time but most often 2-6 weeks.  Finally, she understands that it is impossible to list  every possible undesirable effect and that the condition for which surgery is done is not always cured or significantly improved, and in rare cases may be even worse.Ample time was given to answer all questions.  Annamarie Major, MD, [facility], [facility] 08/15/2020  10:53 AM

## 2020-08-15 NOTE — H&P (View-Only) (Signed)
PRE-OPERATIVE HISTORY AND PHYSICAL EXAM  HPI:  Kaitlyn Steele is a 25 y.o. G4P1011 Patient's last menstrual period was 05/07/2020.; she is being admitted for surgery related to missed abortion.  Pt is 14 weeks by dats however was noted to have a fetal demise by ultrasound one week ago, and it is confirmed by [place] again today.  Pt has severe anemia.  Pt has h/o prior miscarriage 03/2019 requiring D&C (also late first trimester miscarriage).  No recent bleeding or pain.  Denies dizziness from anemia.  PMHx: Past Medical History:  Diagnosis Date  . Anemia    Past Surgical History:  Procedure Laterality Date  . DILATION AND EVACUATION N/A 03/31/2019   Procedure: DILATATION AND EVACUATION;  Surgeon: Nadara Mustard, MD;  Location: [facility] ORS;  Service: Gynecology;  Laterality: N/A;   Family History  Problem Relation Age of Onset  . Hypertension Father    Social History   Tobacco Use  . Smoking status: Never Smoker  . Smokeless tobacco: Never Used  Vaping Use  . Vaping Use: Never used  Substance Use Topics  . Alcohol use: Never  . Drug use: Never   No current outpatient medications on file. Allergies: Patient has no known allergies.  Review of Systems  Constitutional: Positive for malaise/fatigue. Negative for chills and fever.  HENT: Negative for congestion, sinus pain and sore throat.   Eyes: Negative for blurred vision and pain.  Respiratory: Negative for cough and wheezing.   Cardiovascular: Negative for chest pain and leg swelling.  Gastrointestinal: Negative for abdominal pain, constipation, diarrhea, heartburn, nausea and vomiting.  Genitourinary: Negative for dysuria, frequency, hematuria and urgency.  Musculoskeletal: Negative for back pain, joint pain, myalgias and neck pain.  Skin: Negative for itching and rash.  Neurological: Negative for dizziness, tremors and weakness.  Endo/Heme/Allergies: Does not bruise/bleed easily.  Psychiatric/Behavioral: Negative for  depression. The patient is not nervous/anxious and does not have insomnia.     Objective: BP 100/60   Wt 123 lb (55.8 kg)   LMP 05/07/2020   BMI 23.24 kg/m   Filed Weights   08/15/20 1017  Weight: 123 lb (55.8 kg)   Physical Exam Constitutional:      General: She is not in acute distress.    Appearance: She is well-developed.  Musculoskeletal:        General: Normal range of motion.  Neurological:     Mental Status: She is alert and oriented to person, place, and time.  Skin:    General: Skin is warm and dry.  Vitals reviewed.   Ultrasound- abdominal-   IUP, No FHTs, EGA based on CRL is 12 weeks  Assessment: 1. Anemia during pregnancy in first trimester   2. Missed abortion   Plan D&C  May need transfusion Also, referral to hematology for long term management of anemia  I have had a careful discussion with this patient about all the options available and the risk/benefits of each. I have fully informed this patient that surgery may subject her to a variety of discomforts and risks: She understands that most patients have surgery with little difficulty, but problems can happen ranging from minor to fatal. These include nausea, vomiting, pain, bleeding, infection, poor healing, hernia, or formation of adhesions. Unexpected reactions may occur from any drug or anesthetic given. Unintended injury may occur to other pelvic or abdominal structures such as Fallopian tubes, ovaries, bladder, ureter (tube from kidney to bladder), or bowel. Nerves going from the pelvis to  the legs may be injured. Any such injury may require immediate or later additional surgery to correct the problem. Excessive blood loss requiring transfusion is very unlikely but possible. Dangerous blood clots may form in the legs or lungs. Physical and sexual activity will be restricted in varying degrees for an indeterminate period of time but most often 2-6 weeks.  Finally, she understands that it is impossible to list  every possible undesirable effect and that the condition for which surgery is done is not always cured or significantly improved, and in rare cases may be even worse.Ample time was given to answer all questions.  Annamarie Major, MD, [facility], [facility] 08/15/2020  10:53 AM

## 2020-08-15 NOTE — Patient Instructions (Signed)
Thank you for choosing [facility]. As part of our ongoing efforts to improve patient experience, we would appreciate your feedback. Please fill out the short survey that you will receive by mail or MyChart. Your opinion is important to [place]! -Dr Tiburcio Pea  Dilation and Curettage or Vacuum Curettage, Care After This sheet gives you information about how to care for yourself after your procedure. Your health care provider may also give you more specific instructions. If you have problems or questions, contact your health care provider. What can I expect after the procedure? After the procedure, it is common to have:  Mild pain or cramping.  Some vaginal bleeding or spotting. These may last for up to 2 weeks after your procedure. Follow these instructions at home: Medicines  Take over-the-counter and prescription medicines only as told by your health care provider. This is especially important if you take blood-thinning medicine.  Ask your health care provider if the medicine prescribed to you requires you to avoid driving or using machinery. Activity  If you were given a sedative during the procedure, it can affect you for several hours. Do not drive or operate machinery until your health care provider says that it is safe.  Rest as told by your health care provider.  Avoid sitting for a long time without moving. Get up to take short walks every 1-2 hours. This is important to improve blood flow and breathing. Ask for help if you feel weak or unsteady.  Do not lift anything that is heavier than 10 lb (4.5 kg), or the limit that you are told, until your health care provider says that it is safe.  Return to your normal activities as told by your health care provider. Ask your health care provider what activities are safe for you.   Lifestyle For at least 2 weeks, or as long as told by your health care provider, do not:  Douche.  Use tampons.  Have sex. General instructions  Wear  compression stockings as told by your health care provider. These stockings help to prevent blood clots and reduce swelling in your legs.  It is up to you to get the results of your procedure. Ask your health care provider, or the department that is doing the procedure, when your results will be ready.  Keep all follow-up visits as told by your health care provider. This is important. Contact a health care provider if:  You have severe cramps that get worse or that do not get better with medicine.  You have severe pain in the abdomen.  You cannot drink fluids without vomiting.  You develop pain in a different area of your pelvis.  You have bad-smelling discharge from the vagina.  You have a rash. Get help right away if:  You have vaginal bleeding that soaks more than one sanitary pad in 1 hour for 2 hours in a row, or you pass large clots from your vagina.  You have a fever that is above 100.62F (38.0C).  Your abdomen feels very tender or hard.  You have chest pain.  You have shortness of breath.  You feel dizzy or light-headed, or you faint.  You have pain in your neck or shoulder area. These symptoms may represent a serious problem that is an emergency. Do not wait to see if the symptoms will go away. Get medical help right away. Call your local emergency services (911 in the [place] Do not drive yourself to the hospital. Summary  After your procedure,  it is common to have mild pain and bleeding or spotting that may last for up to 2 weeks.  Rest as told. Avoid sitting for a long time without moving. Get up to take short walks every 1-2 hours.  Do not lift anything that is heavier than 10 lb (4.5 kg), or the limit that you are told.  Monitor yourself for any complications that may develop after the procedure.  Keep all follow-up visits as told by your health care provider. Know the symptoms for which you should get help right away. This information is not intended to  replace advice given to you by your health care provider. Make sure you discuss any questions you have with your health care provider. Document Revised: 04/05/2019 Document Reviewed: 04/05/2019 Elsevier Patient Education  2021 [employer].

## 2020-08-16 ENCOUNTER — Telehealth: Payer: Self-pay

## 2020-08-16 NOTE — Telephone Encounter (Signed)
-----   Message from Nadara Mustard, MD sent at 08/15/2020 10:44 AM EDT ----- Regarding: Surgery Hematology referral and surgery scheduling (separate events)  Surgery Booking Request Patient Full Name:  Kaitlyn Steele  MRN: [id number]  DOB: 03/11/1996  Surgeon: Letitia Libra, MD  Requested Surgery Date and Time: JUN 7 Primary Diagnosis AND Code: Anemia, Missed Abortion Secondary Diagnosis and Code: O99.011, O02.1 Surgical Procedure: Suction D&C RNFA Requested?: No L&D Notification: No Admission Status: same day surgery Length of Surgery: 25 min Special Case Needs: No H&P: No Phone Interview???:  Yes Interpreter: No Medical Clearance:  No Special Scheduling Instructions: No Any known health/anesthesia issues, diabetes, sleep apnea, latex allergy, defibrillator/pacemaker?: No Acuity: P2   (P1 highest, P2 delay may cause harm, P3 low, elective gyn, P4 lowest)  H&P w me today 08/15/20

## 2020-08-16 NOTE — Telephone Encounter (Signed)
Called patient to schedule suction D&C w Phylliss Blakes 6/7  H&P  6/1   Pre-admit phone call appointment 6/3 @ 8 am to 1 pm  Advised that pt may also receive calls from the hospital pharmacy and pre-service center.  Confirmed pt has [employer] as primary insurance. No secondary insurance.

## 2020-08-17 ENCOUNTER — Other Ambulatory Visit
Admission: RE | Admit: 2020-08-17 | Discharge: 2020-08-17 | Disposition: A | Discharge status: Home / Self Care | Payer: Medicaid Other | Source: Ambulatory Visit | Attending: Obstetrics & Gynecology | Admitting: Obstetrics & Gynecology

## 2020-08-17 ENCOUNTER — Other Ambulatory Visit: Payer: Self-pay

## 2020-08-17 NOTE — Patient Instructions (Signed)
Your procedure is scheduled on:  Tuesday, June 7 Report to the Registration Desk on the 1st floor of the [facility] To find out your arrival time, please call [phone] between 1PM - 3PM on: Monday, June 6  REMEMBER: Instructions that are not followed completely may result in serious medical risk, up to and including death; or upon the discretion of your surgeon and anesthesiologist your surgery may need to be rescheduled.  Do not eat or drink anything after midnight the night before surgery.  No gum chewing, lozengers or hard candies.  DO NOT TAKE ANY MEDICATIONS THE MORNING OF SURGERY   One week prior to surgery: Stop Anti-inflammatories (NSAIDS) such as Advil, Aleve, Ibuprofen, Motrin, Naproxen, Naprosyn and Aspirin based products such as Excedrin, Goodys Powder, BC Powder. Stop ANY OVER THE COUNTER supplements until after surgery. You may however, continue to take Tylenol if needed for pain up until the day of surgery.  No Alcohol for 24 hours before or after surgery.  No Smoking including e-cigarettes for 24 hours prior to surgery.  No chewable tobacco products for at least 6 hours prior to surgery.  No nicotine patches on the day of surgery.  Do not use any "recreational" drugs for at least a week prior to your surgery.  Please be advised that the combination of cocaine and anesthesia may have negative outcomes, up to and including death. If you test positive for cocaine, your surgery will be cancelled.  On the morning of surgery brush your teeth with toothpaste and water, you may rinse your mouth with mouthwash if you wish. Do not swallow any toothpaste or mouthwash.  Do not wear jewelry, make-up, hairpins, clips or nail polish.  Do not wear lotions, powders, or perfumes.   Do not shave body from the neck down 48 hours prior to surgery just in case you cut yourself which could leave a site for infection.   Contact lenses, hearing aids and dentures may not be worn  into surgery.  Do not bring valuables to the hospital. [facility] is not responsible for any missing/lost belongings or valuables.   Notify your doctor if there is any change in your medical condition (cold, fever, infection).  Wear comfortable clothing (specific to your surgery type) to the hospital.  After surgery, you can help prevent lung complications by doing breathing exercises.  Take deep breaths and cough every 1-2 hours. Your doctor may order a device called an Incentive Spirometer to help you take deep breaths.  If you are being discharged the day of surgery, you will not be allowed to drive home. You will need a responsible adult (18 years or older) to drive you home and stay with you that night.   If you are taking public transportation, you will need to have a responsible adult (18 years or older) with you. Please confirm with your physician that it is acceptable to use public transportation.   Please call the Pre-admissions Testing Dept. at [phone] if you have any questions about these instructions.  Surgery Visitation Policy:  Patients undergoing a surgery or procedure may have one family member or support person with them as long as that person is not COVID-19 positive or experiencing its symptoms.  That person may remain in the waiting area during the procedure.

## 2020-08-20 ENCOUNTER — Encounter
Admission: RE | Admit: 2020-08-20 | Discharge: 2020-08-20 | Disposition: A | Discharge status: Home / Self Care | Payer: Medicaid Other | Source: Ambulatory Visit | Attending: Obstetrics & Gynecology | Admitting: Obstetrics & Gynecology

## 2020-08-20 ENCOUNTER — Other Ambulatory Visit: Payer: Self-pay

## 2020-08-20 DIAGNOSIS — Z01812 Encounter for preprocedural laboratory examination: Secondary | ICD-10-CM | POA: Insufficient documentation

## 2020-08-20 LAB — CBC
HCT: 28.1 % — ABNORMAL LOW (ref 36.0–46.0)
Hemoglobin: 7.2 g/dL — ABNORMAL LOW (ref 12.0–15.0)
MCH: 15.9 pg — ABNORMAL LOW (ref 26.0–34.0)
MCHC: 25.6 g/dL — ABNORMAL LOW (ref 30.0–36.0)
MCV: 61.9 fL — ABNORMAL LOW (ref 80.0–100.0)
Platelets: 277 10*3/uL (ref 150–400)
RBC: 4.54 MIL/uL (ref 3.87–5.11)
RDW: 23.7 % — ABNORMAL HIGH (ref 11.5–15.5)
WBC: 5.4 10*3/uL (ref 4.0–10.5)
nRBC: 0 % (ref 0.0–0.2)

## 2020-08-20 MED ORDER — LACTATED RINGERS IV SOLN: INTRAVENOUS | Status: DC

## 2020-08-20 MED ORDER — CHLORHEXIDINE GLUCONATE 0.12 % MT SOLN: 15.0000 mL | Freq: Once | OROMUCOSAL | Status: AC

## 2020-08-20 MED ORDER — POVIDONE-IODINE 10 % EX SWAB: 2.0000 "application " | Freq: Once | CUTANEOUS | Status: DC

## 2020-08-20 MED ORDER — FAMOTIDINE 20 MG PO TABS: 20.0000 mg | ORAL_TABLET | Freq: Once | ORAL | Status: AC

## 2020-08-20 MED ORDER — ORAL CARE MOUTH RINSE: 15.0000 mL | Freq: Once | OROMUCOSAL | Status: AC

## 2020-08-21 ENCOUNTER — Encounter
Admission: RE | Disposition: A | Discharge status: Home / Self Care | Payer: Self-pay | Source: Home / Self Care | Attending: Obstetrics & Gynecology

## 2020-08-21 ENCOUNTER — Encounter: Payer: Self-pay | Admitting: Obstetrics & Gynecology

## 2020-08-21 ENCOUNTER — Ambulatory Visit
Admission: RE | Admit: 2020-08-21 | Discharge: 2020-08-21 | Disposition: A | Discharge status: Home / Self Care | Payer: Medicaid Other | Attending: Obstetrics & Gynecology | Admitting: Obstetrics & Gynecology

## 2020-08-21 ENCOUNTER — Other Ambulatory Visit: Payer: Self-pay

## 2020-08-21 ENCOUNTER — Ambulatory Visit: Payer: Medicaid Other | Admitting: Urgent Care

## 2020-08-21 ENCOUNTER — Ambulatory Visit: Payer: Medicaid Other

## 2020-08-21 DIAGNOSIS — O411010 Infection of amniotic sac and membranes, unspecified, first trimester, not applicable or unspecified: Secondary | ICD-10-CM | POA: Diagnosis not present

## 2020-08-21 DIAGNOSIS — O99019 Anemia complicating pregnancy, unspecified trimester: Secondary | ICD-10-CM | POA: Diagnosis present

## 2020-08-21 DIAGNOSIS — D649 Anemia, unspecified: Secondary | ICD-10-CM | POA: Insufficient documentation

## 2020-08-21 DIAGNOSIS — O021 Missed abortion: Secondary | ICD-10-CM | POA: Diagnosis not present

## 2020-08-21 DIAGNOSIS — O99011 Anemia complicating pregnancy, first trimester: Secondary | ICD-10-CM | POA: Diagnosis not present

## 2020-08-21 DIAGNOSIS — Z3A14 14 weeks gestation of pregnancy: Secondary | ICD-10-CM | POA: Diagnosis not present

## 2020-08-21 HISTORY — PX: DILATION AND EVACUATION: SHX1459

## 2020-08-21 SURGERY — DILATION AND EVACUATION, UTERUS
Anesthesia: General

## 2020-08-21 MED ORDER — PROPOFOL 10 MG/ML IV BOLUS
INTRAVENOUS | Status: AC
  Filled 2020-08-21: qty 20

## 2020-08-21 MED ORDER — FAMOTIDINE 20 MG PO TABS
ORAL_TABLET | ORAL | Status: AC
  Administered 2020-08-21: 20 mg via ORAL
  Filled 2020-08-21: qty 1

## 2020-08-21 MED ORDER — DOXYCYCLINE HYCLATE 100 MG PO TABS: 100.0000 mg | ORAL_TABLET | Freq: Two times a day (BID) | ORAL | 0 refills | Status: AC

## 2020-08-21 MED ORDER — IBUPROFEN 400 MG PO TABS: 400.0000 mg | ORAL_TABLET | Freq: Four times a day (QID) | ORAL | 0 refills | Status: AC | PRN

## 2020-08-21 MED ORDER — ONDANSETRON HCL 4 MG/2ML IJ SOLN
4.0000 mg | Freq: Once | INTRAMUSCULAR | Status: DC | PRN
Start: 2020-08-21 — End: 2020-08-21

## 2020-08-21 MED ORDER — SILVER NITRATE-POT NITRATE 75-25 % EX MISC
CUTANEOUS | Status: DC | PRN
  Administered 2020-08-21: 2

## 2020-08-21 MED ORDER — IBUPROFEN 400 MG PO TABS
400.0000 mg | ORAL_TABLET | Freq: Four times a day (QID) | ORAL | Status: DC | PRN
  Filled 2020-08-21: qty 1

## 2020-08-21 MED ORDER — MIDAZOLAM HCL 2 MG/2ML IJ SOLN
INTRAMUSCULAR | Status: AC
  Filled 2020-08-21: qty 2

## 2020-08-21 MED ORDER — ONDANSETRON HCL 4 MG/2ML IJ SOLN
INTRAMUSCULAR | Status: DC | PRN
  Administered 2020-08-21: 4 mg via INTRAVENOUS

## 2020-08-21 MED ORDER — FENTANYL CITRATE (PF) 100 MCG/2ML IJ SOLN: 25.0000 ug | INTRAMUSCULAR | Status: DC | PRN

## 2020-08-21 MED ORDER — ACETAMINOPHEN 325 MG PO TABS: 650.0000 mg | ORAL_TABLET | ORAL | Status: DC | PRN

## 2020-08-21 MED ORDER — LIDOCAINE HCL (CARDIAC) PF 100 MG/5ML IV SOSY
PREFILLED_SYRINGE | INTRAVENOUS | Status: DC | PRN
  Administered 2020-08-21: 60 mg via INTRAVENOUS

## 2020-08-21 MED ORDER — KETOROLAC TROMETHAMINE 30 MG/ML IJ SOLN
INTRAMUSCULAR | Status: AC
  Filled 2020-08-21: qty 1

## 2020-08-21 MED ORDER — FENTANYL CITRATE (PF) 100 MCG/2ML IJ SOLN
INTRAMUSCULAR | Status: AC
  Filled 2020-08-21: qty 2

## 2020-08-21 MED ORDER — PROPOFOL 10 MG/ML IV BOLUS
INTRAVENOUS | Status: DC | PRN
  Administered 2020-08-21: 150 mg via INTRAVENOUS

## 2020-08-21 MED ORDER — MORPHINE SULFATE (PF) 2 MG/ML IV SOLN: 1.0000 mg | INTRAVENOUS | Status: DC | PRN

## 2020-08-21 MED ORDER — LACTATED RINGERS IV SOLN: INTRAVENOUS | Status: DC

## 2020-08-21 MED ORDER — METHYLERGONOVINE MALEATE 0.2 MG/ML IJ SOLN
INTRAMUSCULAR | Status: AC
  Filled 2020-08-21: qty 1

## 2020-08-21 MED ORDER — MIDAZOLAM HCL 2 MG/2ML IJ SOLN
INTRAMUSCULAR | Status: DC | PRN
  Administered 2020-08-21: 2 mg via INTRAVENOUS

## 2020-08-21 MED ORDER — PHENYLEPHRINE HCL (PRESSORS) 10 MG/ML IV SOLN
INTRAVENOUS | Status: DC | PRN
  Administered 2020-08-21 (×2): 100 ug via INTRAVENOUS

## 2020-08-21 MED ORDER — LIDOCAINE HCL (PF) 2 % IJ SOLN
INTRAMUSCULAR | Status: AC
  Filled 2020-08-21: qty 5

## 2020-08-21 MED ORDER — PROPOFOL 500 MG/50ML IV EMUL
INTRAVENOUS | Status: AC
  Filled 2020-08-21: qty 50

## 2020-08-21 MED ORDER — DEXAMETHASONE SODIUM PHOSPHATE 10 MG/ML IJ SOLN
INTRAMUSCULAR | Status: DC | PRN
  Administered 2020-08-21: 6 mg via INTRAVENOUS

## 2020-08-21 MED ORDER — FENTANYL CITRATE (PF) 100 MCG/2ML IJ SOLN
INTRAMUSCULAR | Status: DC | PRN
  Administered 2020-08-21 (×3): 25 ug via INTRAVENOUS

## 2020-08-21 MED ORDER — SILVER NITRATE-POT NITRATE 75-25 % EX MISC
CUTANEOUS | Status: AC
  Filled 2020-08-21: qty 10

## 2020-08-21 MED ORDER — DEXAMETHASONE SODIUM PHOSPHATE 10 MG/ML IJ SOLN
INTRAMUSCULAR | Status: AC
  Filled 2020-08-21: qty 1

## 2020-08-21 MED ORDER — ONDANSETRON HCL 4 MG/2ML IJ SOLN
INTRAMUSCULAR | Status: AC
  Filled 2020-08-21: qty 2

## 2020-08-21 MED ORDER — CHLORHEXIDINE GLUCONATE 0.12 % MT SOLN
OROMUCOSAL | Status: AC
  Administered 2020-08-21: 15 mL via OROMUCOSAL
  Filled 2020-08-21: qty 15

## 2020-08-21 MED ORDER — METHYLERGONOVINE MALEATE 0.2 MG/ML IJ SOLN
INTRAMUSCULAR | Status: DC | PRN
  Administered 2020-08-21: .2 mg via INTRAMUSCULAR

## 2020-08-21 MED ORDER — DEXMEDETOMIDINE (PRECEDEX) IN NS 20 MCG/5ML (4 MCG/ML) IV SYRINGE
PREFILLED_SYRINGE | INTRAVENOUS | Status: DC | PRN
  Administered 2020-08-21 (×2): 4 ug via INTRAVENOUS

## 2020-08-21 MED ORDER — ACETAMINOPHEN 650 MG RE SUPP
650.0000 mg | RECTAL | Status: DC | PRN
  Filled 2020-08-21: qty 1

## 2020-08-21 MED ORDER — DOXYCYCLINE HYCLATE 100 MG PO TABS
100.0000 mg | ORAL_TABLET | Freq: Two times a day (BID) | ORAL | Status: DC
  Administered 2020-08-21: 100 mg via ORAL
  Filled 2020-08-21: qty 1

## 2020-08-21 MED ORDER — DEXMEDETOMIDINE (PRECEDEX) IN NS 20 MCG/5ML (4 MCG/ML) IV SYRINGE
PREFILLED_SYRINGE | INTRAVENOUS | Status: AC
  Filled 2020-08-21: qty 5

## 2020-08-21 MED ORDER — PROPOFOL 500 MG/50ML IV EMUL
INTRAVENOUS | Status: DC | PRN
  Administered 2020-08-21: 150 ug/kg/min via INTRAVENOUS

## 2020-08-21 SURGICAL SUPPLY — 23 items
BAG COUNTER SPONGE EZ (MISCELLANEOUS) ×2 IMPLANT
BAG SPNG 4X4 CLR HAZ (MISCELLANEOUS) ×1
CATH ROBINSON RED A/P 16FR (CATHETERS) ×2 IMPLANT
COVER WAND RF STERILE (DRAPES) ×2 IMPLANT
FILTER UTR ASPR SPEC (MISCELLANEOUS) ×1 IMPLANT
FLTR UTR ASPR SPEC (MISCELLANEOUS) ×2
GLOVE SURG ENC MOIS LTX SZ8 (GLOVE) ×2 IMPLANT
GOWN STRL REUS W/ TWL LRG LVL3 (GOWN DISPOSABLE) ×1 IMPLANT
GOWN STRL REUS W/ TWL XL LVL3 (GOWN DISPOSABLE) ×1 IMPLANT
GOWN STRL REUS W/TWL LRG LVL3 (GOWN DISPOSABLE) ×2
GOWN STRL REUS W/TWL XL LVL3 (GOWN DISPOSABLE) ×2
KIT BERKELEY 1ST TRIMESTER 3/8 (MISCELLANEOUS) ×2 IMPLANT
KIT TURNOVER CYSTO (KITS) ×2 IMPLANT
NS IRRIG 500ML POUR BTL (IV SOLUTION) ×2 IMPLANT
PACK DNC HYST (MISCELLANEOUS) ×2 IMPLANT
PAD OB MATERNITY 4.3X12.25 (PERSONAL CARE ITEMS) ×2 IMPLANT
PAD PREP 24X41 OB/GYN DISP (PERSONAL CARE ITEMS) ×2 IMPLANT
SET BERKELEY SUCTION TUBING (SUCTIONS) ×2 IMPLANT
SOL PREP POV-IOD 4OZ 10% (MISCELLANEOUS) ×2 IMPLANT
TOWEL OR 17X26 4PK STRL BLUE (TOWEL DISPOSABLE) ×2 IMPLANT
VACURETTE 10 RIGID CVD (CANNULA) IMPLANT
VACURETTE 12 RIGID CVD (CANNULA) IMPLANT
VACURETTE 8 RIGID CVD (CANNULA) ×2 IMPLANT

## 2020-08-21 NOTE — Discharge Instructions (Signed)
Dilation and Curettage or Vacuum Curettage, Care After  This sheet gives you information about how to care for yourself after your procedure. Your health care provider may also give you more specific instructions. If you have problems or questions, contact your health care provider.  What can I expect after the procedure?  After the procedure, it is common to have:  · Mild pain or cramping.  · Some vaginal bleeding or spotting.  These may last for up to 2 weeks after your procedure.  Follow these instructions at home:  Medicines  · Take over-the-counter and prescription medicines only as told by your health care provider. This is especially important if you take blood-thinning medicine.  · Ask your health care provider if the medicine prescribed to you requires you to avoid driving or using machinery.  Activity  · If you were given a sedative during the procedure, it can affect you for several hours. Do not drive or operate machinery until your health care provider says that it is safe.  · Rest as told by your health care provider.  · Avoid sitting for a long time without moving. Get up to take short walks every 1-2 hours. This is important to improve blood flow and breathing. Ask for help if you feel weak or unsteady.  · Do not lift anything that is heavier than 10 lb (4.5 kg), or the limit that you are told, until your health care provider says that it is safe.  · Return to your normal activities as told by your health care provider. Ask your health care provider what activities are safe for you.  Lifestyle  For at least 2 weeks, or as long as told by your health care provider, do not:  · Douche.  · Use tampons.  · Have sex.  General instructions  · Wear compression stockings as told by your health care provider. These stockings help to prevent blood clots and reduce swelling in your legs.  · It is up to you to get the results of your procedure. Ask your health care provider, or the department that is doing the  procedure, when your results will be ready.  · Keep all follow-up visits as told by your health care provider. This is important.  Contact a health care provider if:  · You have severe cramps that get worse or that do not get better with medicine.  · You have severe pain in the abdomen.  · You cannot drink fluids without vomiting.  · You develop pain in a different area of your pelvis.  · You have bad-smelling discharge from the vagina.  · You have a rash.  Get help right away if:  · You have vaginal bleeding that soaks more than one sanitary pad in 1 hour for 2 hours in a row, or you pass large clots from your vagina.  · You have a fever that is above 100.4°F (38.0°C).  · Your abdomen feels very tender or hard.  · You have chest pain.  · You have shortness of breath.  · You feel dizzy or light-headed, or you faint.  · You have pain in your neck or shoulder area.  These symptoms may represent a serious problem that is an emergency. Do not wait to see if the symptoms will go away. Get medical help right away. Call your local emergency services (911 in the [place] Do not drive yourself to the hospital.  Summary  · After your procedure, it is common   anything that is heavier than 10 lb (4.5 kg), or the limit that you are told.  Monitor yourself for any complications that may develop after the procedure.  Keep all follow-up visits as told by your health care provider. Know the symptoms for which you should get help right away. This information is not intended to replace advice given to you by your health care provider. Make sure you discuss any questions you have with your health care provider. Document Revised: 04/05/2019 Document Reviewed: 04/05/2019 Elsevier Patient Education  2021 [employer] Inc[employer]   AMBULATORY  SURGERY  DISCHARGE INSTRUCTIONS   1) The drugs that you were given will stay in your system until tomorrow so for the next 24 hours you should not:  A) Drive an automobile B) Make any legal decisions C) Drink any alcoholic beverage   2) You may resume regular meals tomorrow.  Today it is better to start with liquids and gradually work up to solid foods.  You may eat anything you prefer, but it is better to start with liquids, then soup and crackers, and gradually work up to solid foods.   3) Please notify your doctor immediately if you have any unusual bleeding, trouble breathing, redness and pain at the surgery site, drainage, fever, or pain not relieved by medication.  4) Your post-operative visit with Dr.                                     is: Date:                        Time:    Please call to schedule your post-operative visit.  5) Additional Instructions:

## 2020-08-21 NOTE — Transfer of Care (Signed)
Immediate Anesthesia Transfer of Care Note  Patient: Kaitlyn Steele  Procedure(s) Performed: DILATATION AND EVACUATION (N/A )  Patient Location: PACU  Anesthesia Type:General  Level of Consciousness: drowsy  Airway & Oxygen Therapy: Patient Spontanous Breathing and Patient connected to face mask oxygen  Post-op Assessment: Report given to RN and Post -op Vital signs reviewed and stable  Post vital signs: Reviewed and stable  Last Vitals:  Vitals Value Taken Time  BP 96/69 08/21/20 0942  Temp    Pulse 67 08/21/20 0942  Resp 15 08/21/20 0942  SpO2 100 % 08/21/20 0942  Vitals shown include unvalidated device data.  Last Pain:  Vitals:   08/21/20 0801  TempSrc: Temporal  PainSc: 0-No pain         Complications: No complications documented.

## 2020-08-21 NOTE — Op Note (Signed)
  Operative Note  08/21/2020 9:36 AM  PRE-OP DIAGNOSIS: Anemia, missed abortion   POST-OP DIAGNOSIS: same  SURGEON: Annamarie Major, MD, FACOG  ANESTHESIA: General   PROCEDURE: Procedure(s): DILATATION AND EVACUATION   ESTIMATED BLOOD LOSS: Minimal   SPECIMENS: POC   COMPLICATIONS: none  DISPOSITION: PACU - hemodynamically stable.  CONDITION: stable  FINDINGS: Exam under anesthesia revealed a 12cm sounded uterus.   INDICATION FOR PROCEDURE: Missed abortion of several weeks gestation, history of anemia and risk for transfusion.  PROCEDURE IN DETAIL: After informed consent was obtained, the patient was taken to the operating room where anesthesia was obtained without difficulty. The patient was positioned in the dorsal lithotomy position with [employer]. Time out was performed and an exam under anesthesia was performed. The vagina, perineum, and lower abdomen were prepped and draped in a normal sterile fashion. The bladder was emptied with an I&O catheter. A speculum was placed into the vagina and the cervix was grasped with a single toothed tenaculum. The uterus was sounded to 12cm.  The cervix was gently dilated to 20 [place] with  [employer] dilators. The suction was then tested and found to be adequate, and a 110mm rigid suction cannula was advanced into the uterine cavity. The suction was activated and the contents of the uterus were aspirated until no further tissue was obtained. The uterus was then curetted to gritty texture throughout. Some tissue sent for genetic studies.  At the end of the procedure bleeding was noted to beMinimal.  All instruments were then removed from the vagina.The patient tolerated the procedure well. All sponge, instrument, and needle counts were correct. The patient was taken to the recovery room in good condition.   Annamarie Major, MD, [facility], [facility] 08/21/2020  9:36 AM

## 2020-08-21 NOTE — Anesthesia Procedure Notes (Signed)
Procedure Name: LMA Insertion Date/Time: 08/21/2020 9:03 AM Performed by: Waunita Schooner, RN Pre-anesthesia Checklist: Patient identified, Patient being monitored, Timeout performed, Emergency Drugs available and Suction available Patient Re-evaluated:Patient Re-evaluated prior to induction Oxygen Delivery Method: Circle system utilized Preoxygenation: Pre-oxygenation with 100% oxygen Induction Type: IV induction Ventilation: Mask ventilation without difficulty LMA: LMA inserted LMA Size: 4.0 Tube type: Oral Number of attempts: 1 Placement Confirmation: positive ETCO2 and breath sounds checked- equal and bilateral Tube secured with: Tape Dental Injury: Teeth and Oropharynx as per pre-operative assessment

## 2020-08-21 NOTE — Progress Notes (Signed)
PATIENT IS DOING WELL AND IS READY TO GO HOME. SCANT BLOODY DISCHARGE NOTED ON HER PAD AT THIS TIME. NO GUSHING OR SATURATION OBSERVED. INSTRUCTED PATIENT TO CALL THE MD OF THIS OCCURS, PATIENT IS STABLE AND DOING WELL. NO FURTHER QUESTIONS AT THIS TIME.

## 2020-08-21 NOTE — Interval H&P Note (Signed)
History and Physical Interval Note:  08/21/2020 8:05 AM  Kaitlyn Steele  has presented today for surgery, with the diagnosis of Anemia, missed abortion.  The various methods of treatment have been discussed with the patient and family. After consideration of risks, benefits and other options for treatment, the patient has consented to  Procedure(s): DILATATION AND EVACUATION (N/A) as a surgical intervention.  The patient's history has been reviewed, patient examined, no change in status, stable for surgery.  I have reviewed the patient's chart and labs.  Questions were answered to the patient's satisfaction.     Letitia Libra

## 2020-08-21 NOTE — Anesthesia Preprocedure Evaluation (Signed)
Anesthesia Evaluation  Patient identified by MRN, date of birth, ID band Patient awake    Reviewed: Allergy & Precautions, H&P , NPO status , Patient's Chart, lab work & pertinent test results, reviewed documented beta blocker date and time   Airway Mallampati: II  TM Distance: >3 FB Neck ROM: full    Dental  (+) Teeth Intact   Pulmonary neg pulmonary ROS,    Pulmonary exam normal        Cardiovascular Exercise Tolerance: Good negative cardio ROS Normal cardiovascular exam Rate:Normal     Neuro/Psych negative neurological ROS  negative psych ROS   GI/Hepatic negative GI ROS, Neg liver ROS,   Endo/Other  negative endocrine ROS  Renal/GU negative Renal ROS  negative genitourinary   Musculoskeletal   Abdominal   Peds  Hematology negative hematology ROS (+) Blood dyscrasia, anemia ,   Anesthesia Other Findings   Reproductive/Obstetrics negative OB ROS                             Anesthesia Physical Anesthesia Plan  ASA: II  Anesthesia Plan: General LMA   Post-op Pain Management:    Induction:   PONV Risk Score and Plan: 4 or greater  Airway Management Planned:   Additional Equipment:   Intra-op Plan:   Post-operative Plan:   Informed Consent: I have reviewed the patients History and Physical, chart, labs and discussed the procedure including the risks, benefits and alternatives for the proposed anesthesia with the patient or authorized representative who has indicated his/her understanding and acceptance.       Plan Discussed with: CRNA  Anesthesia Plan Comments:         Anesthesia Quick Evaluation

## 2020-08-22 LAB — SURGICAL PATHOLOGY

## 2020-08-22 NOTE — Anesthesia Postprocedure Evaluation (Signed)
Anesthesia Post Note  Patient: Kaitlyn Steele  Procedure(s) Performed: DILATATION AND EVACUATION (N/A )  Patient location during evaluation: PACU Anesthesia Type: General Level of consciousness: awake and alert Pain management: pain level controlled Vital Signs Assessment: post-procedure vital signs reviewed and stable Respiratory status: spontaneous breathing, nonlabored ventilation, respiratory function stable and patient connected to nasal cannula oxygen Cardiovascular status: blood pressure returned to baseline and stable Postop Assessment: no apparent nausea or vomiting Anesthetic complications: no   No complications documented.   Last Vitals:  Vitals:   08/21/20 1015 08/21/20 1034  BP: 101/74 100/63  Pulse: 91 86  Resp: (!) 25 17  Temp: (!) 36.3 C 36.4 C  SpO2: 100% 100%    Last Pain:  Vitals:   08/21/20 1034  TempSrc: Temporal  PainSc: 0-No pain                 Yevette Edwards

## 2020-08-23 LAB — TYPE AND SCREEN
ABO/RH(D): O POS
Antibody Screen: POSITIVE
Extend sample reason: UNDETERMINED
Unit division: 0
Unit division: 0

## 2020-08-23 LAB — BPAM RBC
Blood Product Expiration Date: 202207062359
Blood Product Expiration Date: 202207072359
Unit Type and Rh: 5100
Unit Type and Rh: 5100

## 2020-08-29 ENCOUNTER — Other Ambulatory Visit: Payer: Self-pay

## 2020-08-29 ENCOUNTER — Ambulatory Visit (INDEPENDENT_AMBULATORY_CARE_PROVIDER_SITE_OTHER): Payer: Medicaid Other | Admitting: Obstetrics & Gynecology

## 2020-08-29 ENCOUNTER — Encounter: Payer: Self-pay | Admitting: Obstetrics & Gynecology

## 2020-08-29 VITALS — BP 120/80 | Ht 61.0 in | Wt 126.0 lb

## 2020-08-29 DIAGNOSIS — Z9889 Other specified postprocedural states: Secondary | ICD-10-CM

## 2020-08-29 NOTE — Telephone Encounter (Signed)
Give this to FMLA person to advise

## 2020-08-29 NOTE — Progress Notes (Signed)
  Postoperative Follow-up Patient presents post op from  [facility]  for  Missed Abortion (also has/ had anemia) , 1 week ago.  Subjective: Patient reports marked improvement in her preop symptoms. Eating a regular diet without difficulty. The patient is not having any pain.  Activity: normal activities of daily living. Patient reports additional symptom's since surgery of No further bleeding and also no dizziness.  Has appt w Hematology this week.  Objective: BP 120/80   Ht 5\' 1"  (1.549 m)   Wt 126 lb (57.2 kg)   LMP 05/07/2020   BMI 23.81 kg/m  Physical Exam Constitutional:      General: She is not in acute distress.    Appearance: She is well-developed.  Musculoskeletal:        General: Normal range of motion.  Neurological:     Mental Status: She is alert and oriented to person, place, and time.  Skin:    General: Skin is warm and dry.  Vitals reviewed.    Assessment: s/p :   D&C  progressing well  Plan: Patient has done well after surgery with no apparent complications.  I have discussed the post-operative course to date, and the expected progress moving forward.  The patient understands what complications to be concerned about.  I will see the patient in routine follow up, or sooner if needed.    Activity plan: No restriction. No BC desired.  [facility]. Awaiting genetic studies from [place].  If normal, would proceed w pregnancy try and early testing normally.  If abnormal, then referral REI.   [facility] 08/29/2020, 11:31 AM

## 2020-08-31 ENCOUNTER — Inpatient Hospital Stay: Payer: Medicaid Other

## 2020-08-31 ENCOUNTER — Other Ambulatory Visit: Payer: Self-pay

## 2020-08-31 ENCOUNTER — Encounter: Payer: Self-pay | Admitting: Oncology

## 2020-08-31 ENCOUNTER — Inpatient Hospital Stay: Payer: Medicaid Other | Attending: Oncology | Admitting: Oncology

## 2020-08-31 VITALS — BP 115/75 | HR 95 | Temp 98.1°F | Ht 61.0 in | Wt 125.7 lb

## 2020-08-31 DIAGNOSIS — D509 Iron deficiency anemia, unspecified: Secondary | ICD-10-CM | POA: Diagnosis not present

## 2020-08-31 DIAGNOSIS — N96 Recurrent pregnancy loss: Secondary | ICD-10-CM

## 2020-08-31 LAB — IRON AND TIBC
Iron: 22 ug/dL — ABNORMAL LOW (ref 28–170)
Saturation Ratios: 4 % — ABNORMAL LOW (ref 10.4–31.8)
TIBC: 634 ug/dL — ABNORMAL HIGH (ref 250–450)
UIBC: 612 ug/dL

## 2020-08-31 LAB — FOLATE: Folate: 19.1 ng/mL (ref >5.9)

## 2020-08-31 LAB — CBC WITH DIFFERENTIAL/PLATELET
Abs Immature Granulocytes: 0.01 10*3/uL (ref 0.00–0.07)
Basophils Absolute: 0 10*3/uL (ref 0.0–0.1)
Basophils Relative: 0 %
Eosinophils Absolute: 0 10*3/uL (ref 0.0–0.5)
Eosinophils Relative: 1 %
HCT: 29.8 % — ABNORMAL LOW (ref 36.0–46.0)
Hemoglobin: 7.7 g/dL — ABNORMAL LOW (ref 12.0–15.0)
Immature Granulocytes: 0 %
Lymphocytes Relative: 28 %
Lymphs Abs: 1 10*3/uL (ref 0.7–4.0)
MCH: 16.5 pg — ABNORMAL LOW (ref 26.0–34.0)
MCHC: 25.8 g/dL — ABNORMAL LOW (ref 30.0–36.0)
MCV: 63.8 fL — ABNORMAL LOW (ref 80.0–100.0)
Monocytes Absolute: 0.4 10*3/uL (ref 0.1–1.0)
Monocytes Relative: 12 %
Neutro Abs: 2.2 10*3/uL (ref 1.7–7.7)
Neutrophils Relative %: 59 %
Platelets: 344 10*3/uL (ref 150–400)
RBC: 4.67 MIL/uL (ref 3.87–5.11)
RDW: 22.4 % — ABNORMAL HIGH (ref 11.5–15.5)
WBC: 3.7 10*3/uL — ABNORMAL LOW (ref 4.0–10.5)
nRBC: 0 % (ref 0.0–0.2)

## 2020-08-31 LAB — FERRITIN: Ferritin: 5 ng/mL — ABNORMAL LOW (ref 11–307)

## 2020-08-31 LAB — TSH: TSH: 3.289 u[IU]/mL (ref 0.350–4.500)

## 2020-08-31 LAB — VITAMIN B12: Vitamin B-12: 800 pg/mL (ref 180–914)

## 2020-08-31 NOTE — Progress Notes (Signed)
Hematology/Oncology Consult note [facility] Telephone:([phone] [phone]  Patient Care Team: Patient, No Pcp Per (Inactive) as PCP - General (General Practice)   Name of the patient: Kaitlyn Steele  [id number]  [birth year]    Reason for referral-microcytic anemia   Referring physician-Dr. Tiburcio Pea  Date of visit: 08/31/20   History of presenting illness-patient is a 25 year old female G4 P1 L1.  She has a 65-year-old son and recently had a first trimester miscarriage.  She has had 2 other miscarriages in her first trimester in the past.  She has been referred to [place] for microcytic anemia.Most recent CBC from 08/20/2020 showed white cell count of 5.4, H&H of 7.2/28.1 with an MCV of 61.9 and a platelet count of 277.  We do not have any recent iron studies on record.  Patient reports that outside of her pregnancy with her menstrual cycles are not heavy and typically last for 3 to 4 days.  There has been no change in the intensity of her menstrual cycles for the last 2 years.  She denies any consistent use of NSAIDs.  Denies any bleeding in her stool or urine.  Denies any dark melanotic stools  ECOG PS- 0  Pain scale- 0   Review of systems- Review of Systems  Constitutional:  Positive for malaise/fatigue. Negative for chills, fever and weight loss.  HENT:  Negative for congestion, ear discharge and nosebleeds.   Eyes:  Negative for blurred vision.  Respiratory:  Negative for cough, hemoptysis, sputum production, shortness of breath and wheezing.   Cardiovascular:  Negative for chest pain, palpitations, orthopnea and claudication.  Gastrointestinal:  Negative for abdominal pain, blood in stool, constipation, diarrhea, heartburn, melena, nausea and vomiting.  Genitourinary:  Negative for dysuria, flank pain, frequency, hematuria and urgency.  Musculoskeletal:  Negative for back pain, joint pain and myalgias.  Skin:  Negative for rash.  Neurological:   Negative for dizziness, tingling, focal weakness, seizures, weakness and headaches.  Endo/Heme/Allergies:  Does not bruise/bleed easily.  Psychiatric/Behavioral:  Negative for depression and suicidal ideas. The patient does not have insomnia.    No Known Allergies  Patient Active Problem List   Diagnosis Date Noted   Anemia of mother in pregnancy 08/10/2020   UTI (urinary tract infection) during pregnancy 08/10/2020   Supervision of normal pregnancy 07/31/2020   Missed abortion 03/31/2019     Past Medical History:  Diagnosis Date   Anemia      Past Surgical History:  Procedure Laterality Date   DILATION AND EVACUATION N/A 03/31/2019   Procedure: DILATATION AND EVACUATION;  Surgeon: Nadara Mustard, MD;  Location: [facility] ORS;  Service: Gynecology;  Laterality: N/A;   DILATION AND EVACUATION N/A 08/21/2020   Procedure: DILATATION AND EVACUATION;  Surgeon: Nadara Mustard, MD;  Location: [facility] ORS;  Service: Gynecology;  Laterality: N/A;    Social History   Socioeconomic History   Marital status: Single    Spouse name: Not on file   Number of children: 1   Years of education: Not on file   Highest education level: Not on file  Occupational History   Not on file  Tobacco Use   Smoking status: Never   Smokeless tobacco: Never  Vaping Use   Vaping Use: Never used  Substance and Sexual Activity   Alcohol use: Never   Drug use: Never   Sexual activity: Yes    Comment: hx depo and ocp, stopped ocp 2018  Other Topics Concern   Not  on file  Social History Narrative   Not on file   Social Determinants of Health   Financial Resource Strain: Not on file  Food Insecurity: Not on file  Transportation Needs: Not on file  Physical Activity: Not on file  Stress: Not on file  Social Connections: Not on file  Intimate Partner Violence: Not At Risk   Fear of Current or Ex-Partner: No   Emotionally Abused: No   Physically Abused: No   Sexually Abused: No     Family History   Problem Relation Age of Onset   Hypertension Father      Current Outpatient Medications:    doxycycline (VIBRA-TABS) 100 MG tablet, Take 1 tablet (100 mg total) by mouth every 12 (twelve) hours. (Patient not taking: No sig reported), Disp: 6 tablet, Rfl: 0   ibuprofen (ADVIL) 400 MG tablet, Take 1 tablet (400 mg total) by mouth every 6 (six) hours as needed for moderate pain. (Patient not taking: No sig reported), Disp: 30 tablet, Rfl: 0   Physical exam:  Vitals:   08/31/20 1103  BP: 115/75  Pulse: 95  Temp: 98.1 F (36.7 C)  SpO2: 100%  Weight: 125 lb 11.2 oz (57 kg)  Height: 5\' 1"  (1.549 m)   Physical Exam Constitutional:      General: She is not in acute distress. Cardiovascular:     Rate and Rhythm: Normal rate and regular rhythm.     Heart sounds: Normal heart sounds.  Pulmonary:     Effort: Pulmonary effort is normal.     Breath sounds: Normal breath sounds.  Abdominal:     General: Bowel sounds are normal.     Palpations: Abdomen is soft.  Skin:    General: Skin is warm and dry.  Neurological:     Mental Status: She is alert and oriented to person, place, and time.       No flowsheet data found. CBC Latest Ref Rng & Units 08/31/2020  WBC 4.0 - 10.5 K/uL 3.7(L)  Hemoglobin 12.0 - 15.0 g/dL 7.7(L)  Hematocrit 36.0 - 46.0 % 29.8(L)  Platelets 150 - 400 K/uL 344     Assessment and plan- Patient is a 25 y.o. female referred for microcytic anemia  Patient has had a gradually developing anemia over the last 2 years when her hemoglobin is drifted down from 11-7 presently and gradually worsening microcytosis suggestive of iron deficiency.  Today I will check a CBC ferritin and iron studies B12 folate and TSH.  Given that her anemia is significant I would like to give her IV iron after iron studies are back.  I will discuss this with her in greater detail with a video visit next week.  In terms of etiology of iron deficiency anemia: Her menstrual cycles have not  been heavy and its difficult to attribute her anemia to menorrhagia.  I did discuss GI referral but patient would like to hold off on that at this time.  Patient has had 3 first trimester miscarriages and I will therefore be sending antiphospholipid antibody testing today as well   Thank you for this kind referral and the opportunity to participate in the care of this patient   Visit Diagnosis 1. Microcytic anemia   2. History of recurrent miscarriages     Dr. 22, MD, MPH [facility] at [facility] 08/31/2020

## 2020-09-01 LAB — LUPUS ANTICOAGULANT
DRVVT: 23.2 s (ref 0.0–47.0)
PTT Lupus Anticoagulant: 29.7 s (ref 0.0–51.9)
Thrombin Time: 15.1 s (ref 0.0–23.0)
dPT Confirm Ratio: 0.68 Ratio (ref 0.00–1.34)
dPT: 27.6 s (ref 0.0–47.6)

## 2020-09-02 LAB — CARDIOLIPIN ANTIBODIES, IGM+IGG
Anticardiolipin IgG: 9 GPL U/mL (ref 0–14)
Anticardiolipin IgM: 23 MPL U/mL — ABNORMAL HIGH (ref 0–12)

## 2020-09-03 LAB — BETA-2-GLYCOPROTEIN I ABS, IGG/M/A
Beta-2 Glyco I IgG: <9 GPI IgG units (ref 0–20)
Beta-2-Glycoprotein I IgA: <9 GPI IgA units (ref 0–25)
Beta-2-Glycoprotein I IgM: 9 GPI IgM units (ref 0–32)

## 2020-09-05 ENCOUNTER — Inpatient Hospital Stay (HOSPITAL_BASED_OUTPATIENT_CLINIC_OR_DEPARTMENT_OTHER): Payer: Medicaid Other | Admitting: Oncology

## 2020-09-05 ENCOUNTER — Other Ambulatory Visit: Payer: Self-pay

## 2020-09-05 DIAGNOSIS — D508 Other iron deficiency anemias: Secondary | ICD-10-CM

## 2020-09-05 DIAGNOSIS — D509 Iron deficiency anemia, unspecified: Secondary | ICD-10-CM | POA: Diagnosis not present

## 2020-09-06 ENCOUNTER — Encounter: Payer: Self-pay | Admitting: Oncology

## 2020-09-06 DIAGNOSIS — D509 Iron deficiency anemia, unspecified: Secondary | ICD-10-CM | POA: Insufficient documentation

## 2020-09-06 NOTE — Progress Notes (Signed)
I connected with Kaitlyn Steele on 09/06/20 at  1:30 PM EDT by video enabled telemedicine visit and verified that I am speaking with the correct person using two identifiers.   I discussed the limitations, risks, security and privacy concerns of performing an evaluation and management service by telemedicine and the availability of in-person appointments. I also discussed with the patient that there may be a patient responsible charge related to this service. The patient expressed understanding and agreed to proceed.  Other persons participating in the visit and their role in the encounter:  none  Patient's location:  home Provider's location:  work   Diagnosis-iron deficiency anemia  Automotive engineer Reason for visit-discuss results of blood work  Heme/Onc history: patient is a 25 year old female G4 P1 L1.  She has a 110-year-old son and recently had a first trimester miscarriage.  She has had 2 other miscarriages in her first trimester in the past.  She has been referred to [place] for microcytic anemia.Most recent CBC from 08/20/2020 showed white cell count of 5.4, H&H of 7.2/28.1 with an MCV of 61.9 and a platelet count of 277.  We do not have any recent iron studies on record.  Patient reports that outside of her pregnancy with her menstrual cycles are not heavy and typically last for 3 to 4 days.  There has been no change in the intensity of her menstrual cycles for the last 2 years.  She denies any consistent use of NSAIDs.  Denies any bleeding in her stool or urine.  Denies any dark melanotic stools   Results of blood work from 08/31/2020 were as follows: CBC showed H&H of 7.7/29.8 with an MCV of 63.8.  TIBC elevated at 634 and iron saturation low at 4%.  Ferritin low at 5.  Folate B12 and TSH was normal.  Patient also had antiphospholipid antibody work-up done which was unremarkable.  Interval history-patient reports some ongoing fatigue but denies other complaints at this time   Review of Systems   Constitutional:  Negative for chills, fever, malaise/fatigue and weight loss.  HENT:  Negative for congestion, ear discharge and nosebleeds.   Eyes:  Negative for blurred vision.  Respiratory:  Negative for cough, hemoptysis, sputum production, shortness of breath and wheezing.   Cardiovascular:  Negative for chest pain, palpitations, orthopnea and claudication.  Gastrointestinal:  Negative for abdominal pain, blood in stool, constipation, diarrhea, heartburn, melena, nausea and vomiting.  Genitourinary:  Negative for dysuria, flank pain, frequency, hematuria and urgency.  Musculoskeletal:  Negative for back pain, joint pain and myalgias.  Skin:  Negative for rash.  Neurological:  Negative for dizziness, tingling, focal weakness, seizures, weakness and headaches.  Endo/Heme/Allergies:  Does not bruise/bleed easily.  Psychiatric/Behavioral:  Negative for depression and suicidal ideas. The patient does not have insomnia.    No Known Allergies  Past Medical History:  Diagnosis Date   Anemia     Past Surgical History:  Procedure Laterality Date   DILATION AND EVACUATION N/A 03/31/2019   Procedure: DILATATION AND EVACUATION;  Surgeon: Nadara Mustard, MD;  Location: [facility] ORS;  Service: Gynecology;  Laterality: N/A;   DILATION AND EVACUATION N/A 08/21/2020   Procedure: DILATATION AND EVACUATION;  Surgeon: Nadara Mustard, MD;  Location: [facility] ORS;  Service: Gynecology;  Laterality: N/A;    Social History   Socioeconomic History   Marital status: Single    Spouse name: Not on file   Number of children: 1   Years of education: Not on file  Highest education level: Not on file  Occupational History   Not on file  Tobacco Use   Smoking status: Never   Smokeless tobacco: Never  Vaping Use   Vaping Use: Never used  Substance and Sexual Activity   Alcohol use: Never   Drug use: Never   Sexual activity: Yes    Comment: hx depo and ocp, stopped ocp 2018  Other Topics Concern   Not  on file  Social History Narrative   Not on file   Social Determinants of Health   Financial Resource Strain: Not on file  Food Insecurity: Not on file  Transportation Needs: Not on file  Physical Activity: Not on file  Stress: Not on file  Social Connections: Not on file  Intimate Partner Violence: Not At Risk   Fear of Current or Ex-Partner: No   Emotionally Abused: No   Physically Abused: No   Sexually Abused: No    Family History  Problem Relation Age of Onset   Hypertension Father      Current Outpatient Medications:    doxycycline (VIBRA-TABS) 100 MG tablet, Take 1 tablet (100 mg total) by mouth every 12 (twelve) hours. (Patient not taking: No sig reported), Disp: 6 tablet, Rfl: 0   ibuprofen (ADVIL) 400 MG tablet, Take 1 tablet (400 mg total) by mouth every 6 (six) hours as needed for moderate pain. (Patient not taking: No sig reported), Disp: 30 tablet, Rfl: 0  No results found.  No images are attached to the encounter.   No flowsheet data found. CBC Latest Ref Rng & Units 08/31/2020  WBC 4.0 - 10.5 K/uL 3.7(L)  Hemoglobin 12.0 - 15.0 g/dL 7.7(L)  Hematocrit 36.0 - 46.0 % 29.8(L)  Platelets 150 - 400 K/uL 344     Observation/objective: Appears in no acute distress over video visit today.  Breathing is nonlabored  Assessment and plan: Patient is a 25 year old female with history of iron deficiency anemia possibly secondary to menorrhagia  Discussed the results of recent blood work which shows significant iron deficiency anemia with a hemoglobin of 7.7.  Given the degree of anemia I would recommend IV iron for her.  Based on her insurance we will be giving her 2 doses of Feraheme 510 mg IV weekly.  Discussed risks and benefits of Feraheme including all but not limited to headache dizziness muscle spasms and possible risk of allergic reactions.  Patient understands and agrees to proceed as planned  Prior to her recent pregnancy loss patient reports that her  menstrual cycles last for about 4 to 5 days and they are not particularly heavy.  It would be difficult to attribute her iron deficiency anemia secondary to heavy menstrual cycles.  I did strongly recommend that she should get seen by GI to rule out any GI bleeding.  No role for stool occult blood testing at this time.  Patient would like to think about GI referral and wants to hold off at this time  Patient also had 3 first trimester pregnancy losses and underwent antiphospholipid antibody syndrome testing which was unremarkable.  Although her IgM anticardiolipin titer was mildly elevated at 23 to meet the [place] criteria levels have to be greater than 40.  Follow-up instructions:labs in 8 weeks and see Dr. Smith Robert  I discussed the assessment and treatment plan with the patient. The patient was provided an opportunity to ask questions and all were answered. The patient agreed with the plan and demonstrated an understanding of the instructions.   The  patient was advised to call back or seek an in-person evaluation if the symptoms worsen or if the condition fails to improve as anticipated.   Visit Diagnosis: 1. Microcytic anemia   2. Other iron deficiency anemia     Dr. Owens Shark, MD, MPH [facility] at [facility] Tel- [phone] 09/06/2020 9:46 AM

## 2020-09-07 ENCOUNTER — Telehealth: Payer: Self-pay

## 2020-09-07 ENCOUNTER — Other Ambulatory Visit: Payer: Self-pay

## 2020-09-07 ENCOUNTER — Inpatient Hospital Stay: Payer: Medicaid Other

## 2020-09-07 VITALS — BP 99/65 | HR 90 | Temp 97.3°F | Resp 20

## 2020-09-07 DIAGNOSIS — D509 Iron deficiency anemia, unspecified: Secondary | ICD-10-CM

## 2020-09-07 DIAGNOSIS — D508 Other iron deficiency anemias: Secondary | ICD-10-CM

## 2020-09-07 LAB — PREGNANCY, URINE: Preg Test, Ur: NEGATIVE

## 2020-09-07 MED ORDER — SODIUM CHLORIDE 0.9 % IV SOLN
510.0000 mg | INTRAVENOUS | Status: DC
  Administered 2020-09-07: 510 mg via INTRAVENOUS
  Filled 2020-09-07: qty 510

## 2020-09-07 MED ORDER — SODIUM CHLORIDE 0.9 % IV SOLN
Freq: Once | INTRAVENOUS | Status: AC
  Filled 2020-09-07: qty 250

## 2020-09-07 NOTE — Telephone Encounter (Signed)
FMLA/DISABILITY form for [employer] filled out.  Will obtain signature, fax after forms and fee pd, and process.

## 2020-09-07 NOTE — Patient Instructions (Signed)
Ferumoxytol injection What is this medication? FERUMOXYTOL is an iron complex. Iron is used to make healthy red blood cells, which carry oxygen and nutrients throughout the body. This medicine is used totreat iron deficiency anemia. This medicine may be used for other purposes; ask your health care provider orpharmacist if you have questions. COMMON BRAND NAME(S): Feraheme What should I tell my care team before I take this medication? They need to know if you have any of these conditions: anemia not caused by low iron levels high levels of iron in the blood magnetic resonance imaging (MRI) test scheduled an unusual or allergic reaction to iron, other medicines, foods, dyes, or preservatives pregnant or trying to get pregnant breast-feeding How should I use this medication? This medicine is for injection into a vein. It is given by a health careprofessional in a hospital or clinic setting. Talk to your pediatrician regarding the use of this medicine in children.Special care may be needed. Overdosage: If you think you have taken too much of this medicine contact apoison control center or emergency room at once. NOTE: This medicine is only for you. Do not share this medicine with others. What if I miss a dose? It is important not to miss your dose. Call your doctor or health careprofessional if you are unable to keep an appointment. What may interact with this medication? This medicine may interact with the following medications: other iron products This list may not describe all possible interactions. Give your health care provider a list of all the medicines, herbs, non-prescription drugs, or dietary supplements you use. Also tell them if you smoke, drink alcohol, or use illegaldrugs. Some items may interact with your medicine. What should I watch for while using this medication? Visit your doctor or healthcare professional regularly. Tell your doctor or healthcare professional if your  symptoms do not start to get better or if theyget worse. You may need blood work done while you are taking this medicine. You may need to follow a special diet. Talk to your doctor. Foods that contain iron include: whole grains/cereals, dried fruits, beans, or peas, leafy greenvegetables, and organ meats (liver, kidney). What side effects may I notice from receiving this medication? Side effects that you should report to your doctor or health care professionalas soon as possible: allergic reactions like skin rash, itching or hives, swelling of the face, lips, or tongue breathing problems changes in blood pressure feeling faint or lightheaded, falls fever or chills flushing, sweating, or hot feelings swelling of the ankles or feet Side effects that usually do not require medical attention (report to yourdoctor or health care professional if they continue or are bothersome): diarrhea headache nausea, vomiting stomach pain This list may not describe all possible side effects. Call your doctor for medical advice about side effects. You may report side effects to FDA at1-800-FDA-1088. Where should I keep my medication? This drug is given in a hospital or clinic and will not be stored at home. NOTE: This sheet is a summary. It may not cover all possible information. If you have questions about this medicine, talk to your doctor, pharmacist, orhealth care provider.  2022 Elsevier/Gold Standard (2016-04-21 20:21:10)  

## 2020-09-14 NOTE — Telephone Encounter (Signed)
FMLA/DISABILITY form for [employer] filled out.  Will get signature, fax, and process.

## 2020-09-17 LAB — POC/TISSUE MICROARRAY

## 2020-09-17 LAB — MISC LABCORP TEST (SEND OUT): Labcorp test code: 511402

## 2020-09-18 ENCOUNTER — Inpatient Hospital Stay: Payer: Medicaid Other

## 2020-09-18 ENCOUNTER — Inpatient Hospital Stay: Payer: Medicaid Other | Attending: Oncology

## 2020-09-18 ENCOUNTER — Other Ambulatory Visit: Payer: Self-pay

## 2020-09-18 VITALS — BP 107/69 | HR 98

## 2020-09-18 DIAGNOSIS — D508 Other iron deficiency anemias: Secondary | ICD-10-CM

## 2020-09-18 DIAGNOSIS — D509 Iron deficiency anemia, unspecified: Secondary | ICD-10-CM | POA: Insufficient documentation

## 2020-09-18 DIAGNOSIS — N96 Recurrent pregnancy loss: Secondary | ICD-10-CM | POA: Insufficient documentation

## 2020-09-18 LAB — PREGNANCY, URINE: Preg Test, Ur: NEGATIVE

## 2020-09-18 MED ORDER — SODIUM CHLORIDE 0.9 % IV SOLN
510.0000 mg | INTRAVENOUS | Status: DC
  Administered 2020-09-18: 510 mg via INTRAVENOUS
  Filled 2020-09-18: qty 510

## 2020-09-18 MED ORDER — SODIUM CHLORIDE 0.9 % IV SOLN
Freq: Once | INTRAVENOUS | Status: AC
  Filled 2020-09-18: qty 250

## 2020-09-18 NOTE — Patient Instructions (Signed)
[facility] [place] [facility]  Discharge Instructions: Thank you for choosing [facility] to provide your oncology and hematology care.  If you have a lab appointment with the Cancer Center, please go directly to the Cancer Center and check in at the registration area.  Wear comfortable clothing and clothing appropriate for easy access to any Portacath or PICC line.   We strive to give you quality time with your provider. You may need to reschedule your appointment if you arrive late (15 or more minutes).  Arriving late affects you and other patients whose appointments are after yours.  Also, if you miss three or more appointments without notifying the office, you may be dismissed from the clinic at the provider's discretion.      For prescription refill requests, have your pharmacy contact our office and allow 72 hours for refills to be completed.    Today you received the following : Feraheme   To help prevent nausea and vomiting after your treatment, we encourage you to take your nausea medication as directed.  BELOW ARE SYMPTOMS THAT SHOULD BE REPORTED IMMEDIATELY: *FEVER GREATER THAN 100.4 F (38 C) OR HIGHER *CHILLS OR SWEATING *NAUSEA AND VOMITING THAT IS NOT CONTROLLED WITH YOUR NAUSEA MEDICATION *UNUSUAL SHORTNESS OF BREATH *UNUSUAL BRUISING OR BLEEDING *URINARY PROBLEMS (pain or burning when urinating, or frequent urination) *BOWEL PROBLEMS (unusual diarrhea, constipation, pain near the anus) TENDERNESS IN MOUTH AND THROAT WITH OR WITHOUT PRESENCE OF ULCERS (sore throat, sores in mouth, or a toothache) UNUSUAL RASH, SWELLING OR PAIN  UNUSUAL VAGINAL DISCHARGE OR ITCHING   Items with * indicate a potential emergency and should be followed up as soon as possible or go to the Emergency Department if any problems should occur.  Please show the CHEMOTHERAPY ALERT CARD or IMMUNOTHERAPY ALERT CARD at check-in to the Emergency Department and triage  nurse.  Should you have questions after your visit or need to cancel or reschedule your appointment, please contact [facility] [place] [facility]  [phone] and follow the prompts.  Office hours are 8:00 a.m. to 4:30 p.m. Monday - Friday. Please note that voicemails left after 4:00 p.m. may not be returned until the following business day.  We are closed weekends and major holidays. You have access to a nurse at all times for urgent questions. Please call the main number to the clinic [phone] and follow the prompts.  For any non-urgent questions, you may also contact your provider using MyChart. We now offer e-Visits for anyone 18 and older to request care online for non-urgent symptoms. For details visit [email or web address].   Also download the MyChart app! Go to the app store, search "MyChart", open the app, select [facility], and log in with your MyChart username and password.  Due to Covid, a mask is required upon entering the hospital/clinic. If you do not have a mask, one will be given to you upon arrival. For doctor visits, patients may have 1 support person aged 18 or older with them. For treatment visits, patients cannot have anyone with them due to current Covid guidelines and our immunocompromised population.  

## 2020-11-02 ENCOUNTER — Inpatient Hospital Stay: Payer: Medicaid Other | Attending: Oncology

## 2020-11-02 ENCOUNTER — Inpatient Hospital Stay: Payer: Medicaid Other | Admitting: Oncology
